# Patient Record
Sex: Female | Born: 1990 | Race: Black or African American | Hispanic: No | Marital: Single | Smoking: Never smoker
Health system: Southern US, Community
[De-identification: ages and names within clinical notes are randomized; demographics above are authoritative.]

## PROBLEM LIST (undated history)

## (undated) ENCOUNTER — Inpatient Hospital Stay (HOSPITAL_COMMUNITY): Payer: Medicaid Other

## (undated) ENCOUNTER — Inpatient Hospital Stay (HOSPITAL_COMMUNITY): Payer: Self-pay

## (undated) DIAGNOSIS — F419 Anxiety disorder, unspecified: Secondary | ICD-10-CM

## (undated) DIAGNOSIS — A549 Gonococcal infection, unspecified: Secondary | ICD-10-CM

## (undated) DIAGNOSIS — F32A Depression, unspecified: Secondary | ICD-10-CM

## (undated) DIAGNOSIS — I10 Essential (primary) hypertension: Secondary | ICD-10-CM

## (undated) DIAGNOSIS — A749 Chlamydial infection, unspecified: Secondary | ICD-10-CM

## (undated) DIAGNOSIS — O139 Gestational [pregnancy-induced] hypertension without significant proteinuria, unspecified trimester: Secondary | ICD-10-CM

## (undated) DIAGNOSIS — R87629 Unspecified abnormal cytological findings in specimens from vagina: Secondary | ICD-10-CM

## (undated) DIAGNOSIS — K5900 Constipation, unspecified: Secondary | ICD-10-CM

## (undated) DIAGNOSIS — F329 Major depressive disorder, single episode, unspecified: Secondary | ICD-10-CM

---
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
## 2012-09-03 ENCOUNTER — Inpatient Hospital Stay (HOSPITAL_COMMUNITY)
Admission: AD | Admit: 2012-09-03 | Discharge: 2012-09-03 | Disposition: A | Discharge status: Home / Self Care | Payer: Medicaid Other | Source: Ambulatory Visit | Attending: Obstetrics & Gynecology | Admitting: Obstetrics & Gynecology

## 2012-09-03 ENCOUNTER — Encounter (HOSPITAL_COMMUNITY): Payer: Self-pay | Admitting: *Deleted

## 2012-09-03 DIAGNOSIS — B9689 Other specified bacterial agents as the cause of diseases classified elsewhere: Secondary | ICD-10-CM

## 2012-09-03 DIAGNOSIS — A499 Bacterial infection, unspecified: Secondary | ICD-10-CM | POA: Insufficient documentation

## 2012-09-03 DIAGNOSIS — N760 Acute vaginitis: Secondary | ICD-10-CM | POA: Insufficient documentation

## 2012-09-03 DIAGNOSIS — Z3201 Encounter for pregnancy test, result positive: Secondary | ICD-10-CM | POA: Insufficient documentation

## 2012-09-03 HISTORY — DX: Chlamydial infection, unspecified: A74.9

## 2012-09-03 HISTORY — DX: Gonococcal infection, unspecified: A54.9

## 2012-09-03 HISTORY — DX: Constipation, unspecified: K59.00

## 2012-09-03 LAB — WET PREP, GENITAL: Trich, Wet Prep: NONE SEEN

## 2012-09-03 LAB — POCT PREGNANCY, URINE: Preg Test, Ur: POSITIVE — AB

## 2012-09-03 NOTE — MAU Note (Signed)
+  HPT last month.  No bleeding, no pain.  Just wants confirmation

## 2012-09-04 LAB — GC/CHLAMYDIA PROBE AMP: GC Probe RNA: NEGATIVE

## 2012-11-15 ENCOUNTER — Ambulatory Visit (INDEPENDENT_AMBULATORY_CARE_PROVIDER_SITE_OTHER): Payer: Medicaid Other | Admitting: Obstetrics

## 2012-11-15 ENCOUNTER — Encounter: Payer: Self-pay | Admitting: Obstetrics

## 2012-11-15 VITALS — BP 117/70 | Temp 98.2°F | Wt 199.0 lb

## 2012-11-15 DIAGNOSIS — Z3480 Encounter for supervision of other normal pregnancy, unspecified trimester: Secondary | ICD-10-CM

## 2012-11-15 DIAGNOSIS — Z113 Encounter for screening for infections with a predominantly sexual mode of transmission: Secondary | ICD-10-CM

## 2012-11-15 DIAGNOSIS — A568 Sexually transmitted chlamydial infection of other sites: Secondary | ICD-10-CM | POA: Insufficient documentation

## 2012-11-15 DIAGNOSIS — O98319 Other infections with a predominantly sexual mode of transmission complicating pregnancy, unspecified trimester: Secondary | ICD-10-CM

## 2012-11-15 DIAGNOSIS — Z369 Encounter for antenatal screening, unspecified: Secondary | ICD-10-CM

## 2012-11-15 DIAGNOSIS — Z3482 Encounter for supervision of other normal pregnancy, second trimester: Secondary | ICD-10-CM

## 2012-11-15 LAB — POCT URINALYSIS DIPSTICK
Blood, UA: NEGATIVE
Glucose, UA: NEGATIVE
Ketones, UA: NEGATIVE
Nitrite, UA: NEGATIVE
pH, UA: 6

## 2012-11-15 MED ORDER — OB COMPLETE PETITE 35-5-1-200 MG PO CAPS: 1.0000 | ORAL_CAPSULE | Freq: Every day | ORAL | Status: DC

## 2012-11-15 NOTE — Patient Instructions (Signed)
Safe sex.

## 2012-11-15 NOTE — Addendum Note (Signed)
Addended by: George Hugh on: 11/15/2012 12:17 PM   Modules accepted: Orders

## 2012-11-15 NOTE — Progress Notes (Signed)
Pulse- 96 . Subjective:    Kimberly [name] is being seen today for her first obstetrical visit.  This is not a planned pregnancy. She is at [redacted]w[redacted]d gestation. Her obstetrical history is significant for Chlamydia infection. Relationship with FOB: significant other, living together. Patient does not intend to breast feed. Pregnancy history fully reviewed.  Menstrual History: OB History   Grav Para Term Preterm Abortions TAB SAB Ect Mult Living   3 2 2       1       Menarche age: 75 Patient's last menstrual period was 06/23/2012.    The following portions of the patient's history were reviewed and updated as appropriate: allergies, current medications, past family history, past medical history, past social history, past surgical history and problem list.  Review of Systems Pertinent items are noted in HPI.    Objective:    General appearance: alert and no distress Abdomen: normal findings: soft, non-tender Pelvic: cervix normal in appearance, external genitalia normal, no adnexal masses or tenderness, no cervical motion tenderness, uterus normal size, shape, and consistency and Vagina with moderate yellowish discharge    Assessment:    Pregnancy at [redacted]w[redacted]d weeks  PNV Rx   Plan:    Initial labs drawn. Prenatal vitamins. Problem list reviewed and updated. AFP3 discussed: requested. Role of ultrasound in pregnancy discussed; fetal survey: requested. Amniocentesis discussed: not indicated. Follow up in 2 weeks. 50% of 20 min visit spent on counseling and coordination of care.

## 2012-11-16 LAB — VITAMIN D 25 HYDROXY (VIT D DEFICIENCY, FRACTURES): Vit D, 25-Hydroxy: 26 ng/mL — ABNORMAL LOW (ref 30–89)

## 2012-11-16 LAB — OBSTETRIC PANEL
Antibody Screen: NEGATIVE
HCT: 34.5 % — ABNORMAL LOW (ref 36.0–46.0)
Hemoglobin: 11.6 g/dL — ABNORMAL LOW (ref 12.0–15.0)
Hepatitis B Surface Ag: NEGATIVE
Lymphs Abs: 1.4 10*3/uL (ref 0.7–4.0)
Monocytes Absolute: 0.8 10*3/uL (ref 0.1–1.0)
Monocytes Relative: 7 % (ref 3–12)
Neutro Abs: 8 10*3/uL — ABNORMAL HIGH (ref 1.7–7.7)
Neutrophils Relative %: 78 % — ABNORMAL HIGH (ref 43–77)
RBC: 3.98 MIL/uL (ref 3.87–5.11)
Rh Type: POSITIVE
Rubella: 0.79 Index (ref <0.90)

## 2012-11-16 LAB — WET PREP BY MOLECULAR PROBE
Candida species: NEGATIVE
Trichomonas vaginosis: NEGATIVE

## 2012-11-16 LAB — AFP, QUAD SCREEN
AFP: 45.6 IU/mL
Age Alone: 1:1180 {titer}
Down Syndrome Scr Risk Est: 1:700 {titer}
HCG, Total: 23406 m[IU]/mL
MoM for INH: 1.05
MoM for hCG: 2.02
Open Spina bifida: NEGATIVE
Trisomy 18 (Edward) Syndrome Interp.: 1:24700 {titer}

## 2012-11-16 LAB — HIV ANTIBODY (ROUTINE TESTING W REFLEX): HIV: NONREACTIVE

## 2012-11-16 LAB — VARICELLA ZOSTER ANTIBODY, IGG: Varicella IgG: 1946 Index — ABNORMAL HIGH (ref <135.00)

## 2012-11-17 LAB — GC/CHLAMYDIA PROBE AMP: CT Probe RNA: NEGATIVE

## 2012-11-19 LAB — HEMOGLOBINOPATHY EVALUATION
Hemoglobin Other: 0 %
Hgb A2 Quant: 3 % (ref 2.2–3.2)
Hgb A: 97 % (ref 96.8–97.8)

## 2012-11-21 ENCOUNTER — Ambulatory Visit (INDEPENDENT_AMBULATORY_CARE_PROVIDER_SITE_OTHER): Payer: Medicaid Other

## 2012-11-21 DIAGNOSIS — Z3482 Encounter for supervision of other normal pregnancy, second trimester: Secondary | ICD-10-CM

## 2012-11-21 DIAGNOSIS — Z363 Encounter for antenatal screening for malformations: Secondary | ICD-10-CM

## 2012-11-21 DIAGNOSIS — Z369 Encounter for antenatal screening, unspecified: Secondary | ICD-10-CM

## 2012-11-21 DIAGNOSIS — Z1389 Encounter for screening for other disorder: Secondary | ICD-10-CM

## 2012-11-21 LAB — US OB DETAIL + 14 WK

## 2012-11-22 ENCOUNTER — Encounter: Payer: Self-pay | Admitting: Obstetrics

## 2012-11-22 LAB — US OB DETAIL + 14 WK

## 2012-11-27 ENCOUNTER — Encounter: Payer: Self-pay | Admitting: Obstetrics

## 2012-11-27 ENCOUNTER — Ambulatory Visit (INDEPENDENT_AMBULATORY_CARE_PROVIDER_SITE_OTHER): Payer: Medicaid Other | Admitting: Obstetrics

## 2012-11-27 VITALS — BP 136/83 | Temp 99.0°F | Wt 196.0 lb

## 2012-11-27 DIAGNOSIS — Z3480 Encounter for supervision of other normal pregnancy, unspecified trimester: Secondary | ICD-10-CM

## 2012-11-27 LAB — POCT URINALYSIS DIPSTICK
Glucose, UA: NEGATIVE
Nitrite, UA: NEGATIVE
Spec Grav, UA: 1.02
Urobilinogen, UA: NEGATIVE

## 2012-11-27 NOTE — Progress Notes (Signed)
Pulse 114- Patient has no concerns

## 2012-11-29 ENCOUNTER — Other Ambulatory Visit: Payer: Self-pay | Admitting: *Deleted

## 2012-11-29 DIAGNOSIS — B9689 Other specified bacterial agents as the cause of diseases classified elsewhere: Secondary | ICD-10-CM

## 2012-11-29 MED ORDER — TINIDAZOLE 500 MG PO TABS: 1000.0000 mg | ORAL_TABLET | Freq: Every day | ORAL | Status: DC

## 2012-12-06 ENCOUNTER — Encounter: Payer: Self-pay | Admitting: Obstetrics

## 2012-12-24 ENCOUNTER — Ambulatory Visit (INDEPENDENT_AMBULATORY_CARE_PROVIDER_SITE_OTHER): Payer: Medicaid Other | Admitting: Obstetrics

## 2012-12-24 ENCOUNTER — Encounter: Payer: Self-pay | Admitting: Obstetrics

## 2012-12-24 ENCOUNTER — Other Ambulatory Visit: Payer: Medicaid Other | Admitting: *Deleted

## 2012-12-24 VITALS — Temp 98.4°F | Wt 205.8 lb

## 2012-12-24 DIAGNOSIS — Z3480 Encounter for supervision of other normal pregnancy, unspecified trimester: Secondary | ICD-10-CM

## 2012-12-24 DIAGNOSIS — Z3483 Encounter for supervision of other normal pregnancy, third trimester: Secondary | ICD-10-CM

## 2012-12-24 DIAGNOSIS — Z3482 Encounter for supervision of other normal pregnancy, second trimester: Secondary | ICD-10-CM

## 2012-12-24 LAB — POCT URINALYSIS DIPSTICK
Leukocytes, UA: NEGATIVE
Nitrite, UA: NEGATIVE
Urobilinogen, UA: NEGATIVE

## 2012-12-24 NOTE — Progress Notes (Signed)
Pulse: 102

## 2012-12-25 LAB — CBC
HCT: 34.9 % — ABNORMAL LOW (ref 36.0–46.0)
MCH: 29 pg (ref 26.0–34.0)
MCV: 87.3 fL (ref 78.0–100.0)
Platelets: 278 10*3/uL (ref 150–400)
RDW: 13.9 % (ref 11.5–15.5)

## 2012-12-25 LAB — GLUCOSE TOLERANCE, 2 HOURS W/ 1HR: Glucose, Fasting: 60 mg/dL — ABNORMAL LOW (ref 70–99)

## 2012-12-28 ENCOUNTER — Inpatient Hospital Stay (HOSPITAL_COMMUNITY)
Admission: AD | Admit: 2012-12-28 | Discharge: 2012-12-28 | Disposition: A | Discharge status: Home / Self Care | Payer: Medicaid Other | Source: Ambulatory Visit | Attending: Obstetrics & Gynecology | Admitting: Obstetrics & Gynecology

## 2012-12-28 ENCOUNTER — Encounter (HOSPITAL_COMMUNITY): Payer: Self-pay | Admitting: *Deleted

## 2012-12-28 DIAGNOSIS — N760 Acute vaginitis: Secondary | ICD-10-CM | POA: Insufficient documentation

## 2012-12-28 DIAGNOSIS — N898 Other specified noninflammatory disorders of vagina: Secondary | ICD-10-CM

## 2012-12-28 DIAGNOSIS — A499 Bacterial infection, unspecified: Secondary | ICD-10-CM | POA: Insufficient documentation

## 2012-12-28 DIAGNOSIS — O2390 Unspecified genitourinary tract infection in pregnancy, unspecified trimester: Secondary | ICD-10-CM | POA: Insufficient documentation

## 2012-12-28 DIAGNOSIS — B9689 Other specified bacterial agents as the cause of diseases classified elsewhere: Secondary | ICD-10-CM | POA: Insufficient documentation

## 2012-12-28 DIAGNOSIS — L293 Anogenital pruritus, unspecified: Secondary | ICD-10-CM | POA: Insufficient documentation

## 2012-12-28 LAB — URINALYSIS, ROUTINE W REFLEX MICROSCOPIC
Glucose, UA: NEGATIVE mg/dL
Hgb urine dipstick: NEGATIVE
Ketones, ur: 15 mg/dL — AB
Protein, ur: NEGATIVE mg/dL
pH: 6 (ref 5.0–8.0)

## 2012-12-28 LAB — WET PREP, GENITAL
Clue Cells Wet Prep HPF POC: NONE SEEN
Yeast Wet Prep HPF POC: NONE SEEN

## 2012-12-28 MED ORDER — TERCONAZOLE 0.4 % VA CREA: 1.0000 | TOPICAL_CREAM | Freq: Every day | VAGINAL | Status: DC

## 2012-12-28 MED ORDER — TRIAMCINOLONE ACETONIDE 0.1 % EX CREA: TOPICAL_CREAM | Freq: Two times a day (BID) | CUTANEOUS | Status: DC

## 2012-12-28 NOTE — MAU Provider Note (Signed)
History     CSN: [id number]  Arrival date and time: 12/28/12 1709   First Provider Initiated Contact with Patient 12/28/12 1754      Chief Complaint  Patient presents with  . Vaginal Itching   HPI Kimberly [name] is 22 y.o. G2P1001 [redacted]w[redacted]d weeks presenting with ? Yeast infection.  States she is a patient of Dr. Thomes Lolling.  Did not call office.  Had Dx of bacterial vaginosis last week in the office and not having a white , clumpy discharge that itches.  Denies vaginal bleeding or abdominal pain.  + Fetal movement.     Past Medical History  Diagnosis Date  . Gonorrhea   . Chlamydia   . Constipation     Past Surgical History  Procedure Laterality Date  . No past surgeries      Family History  Problem Relation Age of Onset  . Cancer Neg Hx   . Diabetes Neg Hx   . Heart disease Neg Hx   . Hypertension Neg Hx   . Stroke Neg Hx     History  Substance Use Topics  . Smoking status: Never Smoker   . Smokeless tobacco: Never Used  . Alcohol Use: No    Allergies: No Known Allergies  Prescriptions prior to admission  Medication Sig Dispense Refill  . Prenat-FeCbn-FeAspGl-FA-Omega (OB COMPLETE PETITE) 35-5-1-200 MG CAPS Take 1 capsule by mouth daily before breakfast.  90 capsule  3    Review of Systems  Gastrointestinal: Negative for abdominal pain.  Genitourinary:       Neg for vaginal bleeding Positive for external itching, white discharge without odor   Physical Exam   Blood pressure 137/81, pulse 126, temperature 98.9 F (37.2 C), temperature source Oral, resp. rate 18, height 5' 3.75" (1.619 m), weight 206 lb 12.8 oz (93.804 kg), last menstrual period 06/23/2012, SpO2 100.00%.  Physical Exam  Constitutional: She appears well-developed and well-nourished. No distress.  HENT:  Head: Normocephalic.  Neck: Normal range of motion.  Cardiovascular: Normal rate.   Respiratory: Effort normal.  GI: Soft. There is no tenderness.  Genitourinary: There is rash and  tenderness on the right labia. There is no lesion on the right labia. There is rash and tenderness on the left labia. There is no lesion on the left labia. No erythema, tenderness or bleeding around the vagina. Vaginal discharge (small amount of curd like discharge without odor) found.  Bilateral labor majora with mild excoriations without lesions.  Slight red.   Neurological: She is alert.  Skin: Skin is warm and dry.  Psychiatric: She has a normal mood and affect. Her behavior is normal.   Results for orders placed during the hospital encounter of 12/28/12 (from the past 24 hour(s))  URINALYSIS, ROUTINE W REFLEX MICROSCOPIC     Status: Abnormal   Collection Time    12/28/12  5:15 PM      Result Value Range   Color, Urine YELLOW  YELLOW   APPearance CLEAR  CLEAR   Specific Gravity, Urine >1.030 (*) 1.005 - 1.030   pH 6.0  5.0 - 8.0   Glucose, UA NEGATIVE  NEGATIVE mg/dL   Hgb urine dipstick NEGATIVE  NEGATIVE   Bilirubin Urine NEGATIVE  NEGATIVE   Ketones, ur 15 (*) NEGATIVE mg/dL   Protein, ur NEGATIVE  NEGATIVE mg/dL   Urobilinogen, UA 0.2  0.0 - 1.0 mg/dL   Nitrite NEGATIVE  NEGATIVE   Leukocytes, UA NEGATIVE  NEGATIVE  WET PREP, GENITAL  Status: Abnormal   Collection Time    12/28/12  6:00 PM      Result Value Range   Yeast Wet Prep HPF POC NONE SEEN  NONE SEEN   Trich, Wet Prep NONE SEEN  NONE SEEN   Clue Cells Wet Prep HPF POC NONE SEEN  NONE SEEN   WBC, Wet Prep HPF POC FEW (*) NONE SEEN    MAU Course  Procedures  MDM   Assessment and Plan  A;  Vaginal pruritis after antibiotic treatment for Bacterial vaginosis  P:  Rx for Terozol 7 Vaginal Creme, 1 applicator at hs X 7      Rx for triamcinolone cream to mix with a small amount of terazol to apply externally to affected area bid prn    Follow up with Dr. Clearance Coots.   Soo Steelman,EVE M 12/28/2012, 6:32 PM

## 2012-12-28 NOTE — MAU Note (Signed)
Pt states vaginal itching started on Monday with white discharge.  Pt also c/o vaginal area being swollen.  Denies vaginal bleeding or ROM.  Good fetal movement.

## 2013-01-07 ENCOUNTER — Encounter: Payer: Self-pay | Admitting: Obstetrics

## 2013-01-07 ENCOUNTER — Ambulatory Visit (INDEPENDENT_AMBULATORY_CARE_PROVIDER_SITE_OTHER): Payer: Medicaid Other | Admitting: Obstetrics

## 2013-01-07 VITALS — BP 133/83 | Temp 98.0°F | Wt 206.0 lb

## 2013-01-07 DIAGNOSIS — Z3480 Encounter for supervision of other normal pregnancy, unspecified trimester: Secondary | ICD-10-CM

## 2013-01-07 LAB — POCT URINALYSIS DIPSTICK
Nitrite, UA: NEGATIVE
Urobilinogen, UA: NEGATIVE
pH, UA: 6

## 2013-01-07 NOTE — Progress Notes (Signed)
Pulse-110 Pt c/o right side headaches every morning x 1 week.

## 2013-01-21 ENCOUNTER — Encounter: Payer: Self-pay | Admitting: Obstetrics

## 2013-01-21 ENCOUNTER — Ambulatory Visit (INDEPENDENT_AMBULATORY_CARE_PROVIDER_SITE_OTHER): Payer: Medicaid Other | Admitting: Obstetrics

## 2013-01-21 VITALS — BP 128/79 | Temp 98.5°F | Wt 209.0 lb

## 2013-01-21 DIAGNOSIS — Z3483 Encounter for supervision of other normal pregnancy, third trimester: Secondary | ICD-10-CM

## 2013-01-21 DIAGNOSIS — Z3480 Encounter for supervision of other normal pregnancy, unspecified trimester: Secondary | ICD-10-CM

## 2013-01-21 LAB — POCT URINALYSIS DIPSTICK
Blood, UA: NEGATIVE
Nitrite, UA: NEGATIVE
Urobilinogen, UA: NEGATIVE
pH, UA: 5

## 2013-01-21 NOTE — Progress Notes (Signed)
Pulse-101 No complaints.

## 2013-02-04 ENCOUNTER — Ambulatory Visit (INDEPENDENT_AMBULATORY_CARE_PROVIDER_SITE_OTHER): Payer: Medicaid Other | Admitting: Obstetrics

## 2013-02-04 ENCOUNTER — Encounter: Payer: Self-pay | Admitting: Obstetrics

## 2013-02-04 VITALS — BP 120/82 | Temp 99.2°F | Wt 213.4 lb

## 2013-02-04 DIAGNOSIS — Z3480 Encounter for supervision of other normal pregnancy, unspecified trimester: Secondary | ICD-10-CM

## 2013-02-04 DIAGNOSIS — Z3483 Encounter for supervision of other normal pregnancy, third trimester: Secondary | ICD-10-CM

## 2013-02-04 LAB — POCT URINALYSIS DIPSTICK
Blood, UA: NEGATIVE
Glucose, UA: NEGATIVE
Ketones, UA: NEGATIVE
Protein, UA: NEGATIVE
Spec Grav, UA: 1.02
Urobilinogen, UA: NEGATIVE

## 2013-02-04 NOTE — Progress Notes (Signed)
Pulse: 97

## 2013-02-18 ENCOUNTER — Ambulatory Visit (INDEPENDENT_AMBULATORY_CARE_PROVIDER_SITE_OTHER): Payer: Medicaid Other | Admitting: Obstetrics

## 2013-02-18 ENCOUNTER — Encounter: Payer: Self-pay | Admitting: Obstetrics

## 2013-02-18 VITALS — BP 133/82 | Temp 98.3°F | Wt 214.0 lb

## 2013-02-18 DIAGNOSIS — Z3480 Encounter for supervision of other normal pregnancy, unspecified trimester: Secondary | ICD-10-CM

## 2013-02-18 LAB — POCT URINALYSIS DIPSTICK
Bilirubin, UA: NEGATIVE
Ketones, UA: NEGATIVE
Spec Grav, UA: 1.02

## 2013-02-18 NOTE — Progress Notes (Signed)
Pulse-104 No complaints

## 2013-02-20 LAB — STREP B DNA PROBE: GBSP: POSITIVE

## 2013-02-25 ENCOUNTER — Encounter: Payer: Self-pay | Admitting: Obstetrics

## 2013-02-25 ENCOUNTER — Ambulatory Visit (INDEPENDENT_AMBULATORY_CARE_PROVIDER_SITE_OTHER): Payer: Medicaid Other | Admitting: Obstetrics

## 2013-02-25 VITALS — BP 136/84 | Temp 98.8°F | Wt 217.0 lb

## 2013-02-25 DIAGNOSIS — Z3480 Encounter for supervision of other normal pregnancy, unspecified trimester: Secondary | ICD-10-CM

## 2013-02-25 DIAGNOSIS — Z3483 Encounter for supervision of other normal pregnancy, third trimester: Secondary | ICD-10-CM

## 2013-02-25 LAB — POCT URINALYSIS DIPSTICK
Glucose, UA: NEGATIVE
Ketones, UA: NEGATIVE
Protein, UA: NEGATIVE
Spec Grav, UA: 1.015
pH, UA: 6

## 2013-02-25 NOTE — Progress Notes (Signed)
P- 123

## 2013-03-04 ENCOUNTER — Encounter: Payer: Medicaid Other | Admitting: Obstetrics

## 2013-03-11 ENCOUNTER — Encounter: Payer: Medicaid Other | Admitting: Obstetrics

## 2013-03-13 ENCOUNTER — Ambulatory Visit (INDEPENDENT_AMBULATORY_CARE_PROVIDER_SITE_OTHER): Payer: Medicaid Other | Admitting: Obstetrics

## 2013-03-13 ENCOUNTER — Encounter: Payer: Self-pay | Admitting: Obstetrics

## 2013-03-13 VITALS — BP 130/84 | Temp 98.4°F | Wt 215.6 lb

## 2013-03-13 DIAGNOSIS — Z3483 Encounter for supervision of other normal pregnancy, third trimester: Secondary | ICD-10-CM

## 2013-03-13 DIAGNOSIS — Z3480 Encounter for supervision of other normal pregnancy, unspecified trimester: Secondary | ICD-10-CM

## 2013-03-13 NOTE — Progress Notes (Signed)
Pulse: 89

## 2013-03-14 LAB — POCT URINALYSIS DIPSTICK
Bilirubin, UA: NEGATIVE
Blood, UA: NEGATIVE
Ketones, UA: NEGATIVE
pH, UA: 7

## 2013-03-19 ENCOUNTER — Ambulatory Visit (INDEPENDENT_AMBULATORY_CARE_PROVIDER_SITE_OTHER): Payer: Medicaid Other | Admitting: Obstetrics

## 2013-03-19 ENCOUNTER — Inpatient Hospital Stay (HOSPITAL_COMMUNITY)
Admission: AD | Admit: 2013-03-19 | Discharge: 2013-03-23 | DRG: 765 | Disposition: A | Discharge status: Home / Self Care | Payer: Medicaid Other | Source: Ambulatory Visit | Attending: Obstetrics | Admitting: Obstetrics

## 2013-03-19 VITALS — BP 134/84 | Temp 98.8°F | Wt 218.0 lb

## 2013-03-19 DIAGNOSIS — O364XX0 Maternal care for intrauterine death, not applicable or unspecified: Secondary | ICD-10-CM | POA: Clinically undetermined

## 2013-03-19 DIAGNOSIS — Z3482 Encounter for supervision of other normal pregnancy, second trimester: Secondary | ICD-10-CM

## 2013-03-19 DIAGNOSIS — O99892 Other specified diseases and conditions complicating childbirth: Secondary | ICD-10-CM | POA: Diagnosis present

## 2013-03-19 DIAGNOSIS — O4590 Premature separation of placenta, unspecified, unspecified trimester: Secondary | ICD-10-CM | POA: Diagnosis present

## 2013-03-19 DIAGNOSIS — Z22330 Carrier of Group B streptococcus: Secondary | ICD-10-CM

## 2013-03-19 DIAGNOSIS — O365990 Maternal care for other known or suspected poor fetal growth, unspecified trimester, not applicable or unspecified: Secondary | ICD-10-CM | POA: Diagnosis present

## 2013-03-19 DIAGNOSIS — Z3480 Encounter for supervision of other normal pregnancy, unspecified trimester: Secondary | ICD-10-CM

## 2013-03-19 DIAGNOSIS — Z3483 Encounter for supervision of other normal pregnancy, third trimester: Secondary | ICD-10-CM

## 2013-03-19 LAB — POCT URINALYSIS DIPSTICK
Glucose, UA: 50
Spec Grav, UA: 1.02

## 2013-03-19 NOTE — Progress Notes (Signed)
Pulse: 108

## 2013-03-19 NOTE — MAU Note (Signed)
Contractions tonight with some bloody show. Denies LOF. Cervix has not been checked with this pregnancy. Previous TSVD.

## 2013-03-20 ENCOUNTER — Encounter (HOSPITAL_COMMUNITY): Payer: Self-pay | Admitting: Anesthesiology

## 2013-03-20 ENCOUNTER — Inpatient Hospital Stay (HOSPITAL_COMMUNITY): Payer: Medicaid Other | Admitting: Anesthesiology

## 2013-03-20 ENCOUNTER — Encounter (HOSPITAL_COMMUNITY): Payer: Self-pay

## 2013-03-20 ENCOUNTER — Encounter (HOSPITAL_COMMUNITY)
Admission: AD | Disposition: A | Discharge status: Home / Self Care | Payer: Self-pay | Source: Ambulatory Visit | Attending: Obstetrics

## 2013-03-20 DIAGNOSIS — O43019 Fetomaternal placental transfusion syndrome, unspecified trimester: Secondary | ICD-10-CM

## 2013-03-20 LAB — CBC
HCT: 29.3 % — ABNORMAL LOW (ref 36.0–46.0)
HCT: 35.9 % — ABNORMAL LOW (ref 36.0–46.0)
Hemoglobin: 10.2 g/dL — ABNORMAL LOW (ref 12.0–15.0)
MCHC: 34.8 g/dL (ref 30.0–36.0)
MCHC: 35.1 g/dL (ref 30.0–36.0)
MCV: 85.7 fL (ref 78.0–100.0)
Platelets: 222 10*3/uL (ref 150–400)
RBC: 3.41 MIL/uL — ABNORMAL LOW (ref 3.87–5.11)
RDW: 12.6 % (ref 11.5–15.5)
WBC: 14.1 10*3/uL — ABNORMAL HIGH (ref 4.0–10.5)
WBC: 16 10*3/uL — ABNORMAL HIGH (ref 4.0–10.5)

## 2013-03-20 LAB — PROTIME-INR
INR: 0.99 (ref 0.00–1.49)
Prothrombin Time: 12.9 seconds (ref 11.6–15.2)

## 2013-03-20 SURGERY — Surgical Case
Anesthesia: General | Site: Abdomen | Wound class: Clean Contaminated

## 2013-03-20 MED ORDER — LACTATED RINGERS IV SOLN: 500.0000 mL | INTRAVENOUS | Status: DC | PRN

## 2013-03-20 MED ORDER — TETANUS-DIPHTH-ACELL PERTUSSIS 5-2.5-18.5 LF-MCG/0.5 IM SUSP
0.5000 mL | Freq: Once | INTRAMUSCULAR | Status: AC
  Administered 2013-03-21: 0.5 mL via INTRAMUSCULAR
  Filled 2013-03-20: qty 0.5

## 2013-03-20 MED ORDER — ONDANSETRON HCL 4 MG/2ML IJ SOLN: 4.0000 mg | Freq: Four times a day (QID) | INTRAMUSCULAR | Status: DC | PRN

## 2013-03-20 MED ORDER — HYDROMORPHONE 0.3 MG/ML IV SOLN: INTRAVENOUS | Status: DC

## 2013-03-20 MED ORDER — MENTHOL 3 MG MT LOZG: 1.0000 | LOZENGE | OROMUCOSAL | Status: DC | PRN

## 2013-03-20 MED ORDER — OXYTOCIN BOLUS FROM INFUSION: 500.0000 mL | INTRAVENOUS | Status: DC

## 2013-03-20 MED ORDER — DIPHENHYDRAMINE HCL 50 MG/ML IJ SOLN: 12.5000 mg | Freq: Four times a day (QID) | INTRAMUSCULAR | Status: DC | PRN

## 2013-03-20 MED ORDER — CITRIC ACID-SODIUM CITRATE 334-500 MG/5ML PO SOLN: 30.0000 mL | ORAL | Status: DC | PRN

## 2013-03-20 MED ORDER — METOCLOPRAMIDE HCL 5 MG/ML IJ SOLN
INTRAMUSCULAR | Status: AC
  Filled 2013-03-20: qty 2

## 2013-03-20 MED ORDER — CEFAZOLIN SODIUM-DEXTROSE 2-3 GM-% IV SOLR
INTRAVENOUS | Status: DC | PRN
  Administered 2013-03-20: 2 g via INTRAVENOUS

## 2013-03-20 MED ORDER — LIDOCAINE HCL (PF) 1 % IJ SOLN
30.0000 mL | INTRAMUSCULAR | Status: DC | PRN
  Filled 2013-03-20: qty 30

## 2013-03-20 MED ORDER — KETOROLAC TROMETHAMINE 30 MG/ML IJ SOLN
INTRAMUSCULAR | Status: AC
  Filled 2013-03-20: qty 1

## 2013-03-20 MED ORDER — OXYTOCIN 40 UNITS IN LACTATED RINGERS INFUSION - SIMPLE MED: 62.5000 mL/h | INTRAVENOUS | Status: DC

## 2013-03-20 MED ORDER — MEASLES, MUMPS & RUBELLA VAC ~~LOC~~ INJ
0.5000 mL | INJECTION | Freq: Once | SUBCUTANEOUS | Status: AC
  Administered 2013-03-22: 0.5 mL via SUBCUTANEOUS
  Filled 2013-03-20: qty 0.5

## 2013-03-20 MED ORDER — PRENATAL MULTIVITAMIN CH
1.0000 | ORAL_TABLET | Freq: Every day | ORAL | Status: DC
  Administered 2013-03-20 – 2013-03-22 (×3): 1 via ORAL
  Filled 2013-03-20 (×3): qty 1

## 2013-03-20 MED ORDER — KETOROLAC TROMETHAMINE 30 MG/ML IJ SOLN
15.0000 mg | Freq: Once | INTRAMUSCULAR | Status: AC | PRN
  Administered 2013-03-20: 30 mg via INTRAVENOUS

## 2013-03-20 MED ORDER — HYDROMORPHONE 0.3 MG/ML IV SOLN
INTRAVENOUS | Status: DC
  Administered 2013-03-20: 04:00:00 via INTRAVENOUS
  Administered 2013-03-20: 3.3 mg via INTRAVENOUS
  Administered 2013-03-20: 2 mg via INTRAVENOUS
  Filled 2013-03-20: qty 25

## 2013-03-20 MED ORDER — FAMOTIDINE IN NACL 20-0.9 MG/50ML-% IV SOLN
20.0000 mg | Freq: Once | INTRAVENOUS | Status: AC
  Administered 2013-03-20: 20 mg via INTRAVENOUS
  Filled 2013-03-20: qty 50

## 2013-03-20 MED ORDER — PROPOFOL 10 MG/ML IV BOLUS
INTRAVENOUS | Status: DC | PRN
  Administered 2013-03-20: 200 mg via INTRAVENOUS

## 2013-03-20 MED ORDER — SUCCINYLCHOLINE CHLORIDE 20 MG/ML IJ SOLN
INTRAMUSCULAR | Status: DC | PRN
  Administered 2013-03-20: 140 mg via INTRAVENOUS

## 2013-03-20 MED ORDER — CITRIC ACID-SODIUM CITRATE 334-500 MG/5ML PO SOLN
ORAL | Status: AC
  Administered 2013-03-20: 30 mL
  Filled 2013-03-20: qty 15

## 2013-03-20 MED ORDER — MIDAZOLAM HCL 2 MG/2ML IJ SOLN
INTRAMUSCULAR | Status: DC | PRN
  Administered 2013-03-20: 2 mg via INTRAVENOUS

## 2013-03-20 MED ORDER — SIMETHICONE 80 MG PO CHEW
80.0000 mg | CHEWABLE_TABLET | Freq: Three times a day (TID) | ORAL | Status: DC
  Administered 2013-03-20 – 2013-03-22 (×3): 80 mg via ORAL

## 2013-03-20 MED ORDER — SODIUM CHLORIDE 0.9 % IJ SOLN: 9.0000 mL | INTRAMUSCULAR | Status: DC | PRN

## 2013-03-20 MED ORDER — HYDROMORPHONE HCL PF 1 MG/ML IJ SOLN
INTRAMUSCULAR | Status: AC
  Filled 2013-03-20: qty 1

## 2013-03-20 MED ORDER — HYDROMORPHONE HCL PF 1 MG/ML IJ SOLN
0.2500 mg | INTRAMUSCULAR | Status: DC | PRN
  Administered 2013-03-20 (×4): 0.5 mg via INTRAVENOUS

## 2013-03-20 MED ORDER — OXYTOCIN 40 UNITS IN LACTATED RINGERS INFUSION - SIMPLE MED: 62.5000 mL/h | INTRAVENOUS | Status: AC

## 2013-03-20 MED ORDER — NALOXONE HCL 0.4 MG/ML IJ SOLN: 0.4000 mg | INTRAMUSCULAR | Status: DC | PRN

## 2013-03-20 MED ORDER — DIBUCAINE 1 % RE OINT: 1.0000 "application " | TOPICAL_OINTMENT | RECTAL | Status: DC | PRN

## 2013-03-20 MED ORDER — OXYCODONE-ACETAMINOPHEN 5-325 MG PO TABS
1.0000 | ORAL_TABLET | ORAL | Status: DC | PRN
  Administered 2013-03-20 (×2): 2 via ORAL
  Administered 2013-03-21: 1 via ORAL
  Administered 2013-03-21 – 2013-03-23 (×4): 2 via ORAL
  Filled 2013-03-20 (×4): qty 2
  Filled 2013-03-20: qty 1
  Filled 2013-03-20 (×2): qty 2

## 2013-03-20 MED ORDER — LACTATED RINGERS IV SOLN
INTRAVENOUS | Status: DC
  Administered 2013-03-20: 01:00:00 via INTRAVENOUS
  Administered 2013-03-20: 125 mL/h via INTRAVENOUS

## 2013-03-20 MED ORDER — ZOLPIDEM TARTRATE 5 MG PO TABS: 5.0000 mg | ORAL_TABLET | Freq: Every evening | ORAL | Status: DC | PRN

## 2013-03-20 MED ORDER — METOCLOPRAMIDE HCL 5 MG/ML IJ SOLN
10.0000 mg | Freq: Once | INTRAMUSCULAR | Status: AC
  Administered 2013-03-20: 10 mg via INTRAVENOUS

## 2013-03-20 MED ORDER — FENTANYL CITRATE 0.05 MG/ML IJ SOLN
INTRAMUSCULAR | Status: DC | PRN
  Administered 2013-03-20 (×2): 250 ug via INTRAVENOUS

## 2013-03-20 MED ORDER — OXYTOCIN 10 UNIT/ML IJ SOLN
40.0000 [IU] | INTRAVENOUS | Status: DC | PRN
  Administered 2013-03-20: 40 [IU] via INTRAVENOUS

## 2013-03-20 MED ORDER — IBUPROFEN 600 MG PO TABS
600.0000 mg | ORAL_TABLET | Freq: Four times a day (QID) | ORAL | Status: DC
  Administered 2013-03-20 – 2013-03-23 (×12): 600 mg via ORAL
  Filled 2013-03-20 (×12): qty 1

## 2013-03-20 MED ORDER — 0.9 % SODIUM CHLORIDE (POUR BTL) OPTIME
TOPICAL | Status: DC | PRN
  Administered 2013-03-20: 1000 mL

## 2013-03-20 MED ORDER — ACETAMINOPHEN 325 MG PO TABS: 650.0000 mg | ORAL_TABLET | ORAL | Status: DC | PRN

## 2013-03-20 MED ORDER — BUTORPHANOL TARTRATE 1 MG/ML IJ SOLN: 1.0000 mg | INTRAMUSCULAR | Status: DC | PRN

## 2013-03-20 MED ORDER — DIPHENHYDRAMINE HCL 25 MG PO CAPS: 25.0000 mg | ORAL_CAPSULE | Freq: Four times a day (QID) | ORAL | Status: DC | PRN

## 2013-03-20 MED ORDER — ONDANSETRON HCL 4 MG/2ML IJ SOLN
INTRAMUSCULAR | Status: DC | PRN
  Administered 2013-03-20: 4 mg via INTRAVENOUS

## 2013-03-20 MED ORDER — OXYCODONE-ACETAMINOPHEN 5-325 MG PO TABS: 1.0000 | ORAL_TABLET | ORAL | Status: DC | PRN

## 2013-03-20 MED ORDER — SODIUM CHLORIDE 0.9 % IV SOLN
2.0000 g | Freq: Once | INTRAVENOUS | Status: DC
  Filled 2013-03-20: qty 2000

## 2013-03-20 MED ORDER — IBUPROFEN 600 MG PO TABS: 600.0000 mg | ORAL_TABLET | Freq: Four times a day (QID) | ORAL | Status: DC | PRN

## 2013-03-20 MED ORDER — ONDANSETRON HCL 4 MG/2ML IJ SOLN: 4.0000 mg | INTRAMUSCULAR | Status: DC | PRN

## 2013-03-20 MED ORDER — ONDANSETRON HCL 4 MG/2ML IJ SOLN: 4.0000 mg | Freq: Once | INTRAMUSCULAR | Status: DC | PRN

## 2013-03-20 MED ORDER — FLEET ENEMA 7-19 GM/118ML RE ENEM: 1.0000 | ENEMA | RECTAL | Status: DC | PRN

## 2013-03-20 MED ORDER — WITCH HAZEL-GLYCERIN EX PADS: 1.0000 "application " | MEDICATED_PAD | CUTANEOUS | Status: DC | PRN

## 2013-03-20 MED ORDER — SIMETHICONE 80 MG PO CHEW: 80.0000 mg | CHEWABLE_TABLET | ORAL | Status: DC | PRN

## 2013-03-20 MED ORDER — LANOLIN HYDROUS EX OINT: 1.0000 "application " | TOPICAL_OINTMENT | CUTANEOUS | Status: DC | PRN

## 2013-03-20 MED ORDER — SENNOSIDES-DOCUSATE SODIUM 8.6-50 MG PO TABS
2.0000 | ORAL_TABLET | Freq: Every day | ORAL | Status: DC
  Administered 2013-03-20 – 2013-03-22 (×3): 2 via ORAL

## 2013-03-20 MED ORDER — ONDANSETRON HCL 4 MG PO TABS: 4.0000 mg | ORAL_TABLET | ORAL | Status: DC | PRN

## 2013-03-20 MED ORDER — DIPHENHYDRAMINE HCL 12.5 MG/5ML PO ELIX: 12.5000 mg | ORAL_SOLUTION | Freq: Four times a day (QID) | ORAL | Status: DC | PRN

## 2013-03-20 MED ORDER — LACTATED RINGERS IV SOLN
INTRAVENOUS | Status: DC
  Administered 2013-03-20: 06:00:00 via INTRAVENOUS

## 2013-03-20 MED ORDER — LACTATED RINGERS IV SOLN
INTRAVENOUS | Status: DC | PRN
  Administered 2013-03-20: 01:00:00 via INTRAVENOUS

## 2013-03-20 MED FILL — Epinephrine HCl Soln Prefilled Syringe 0.1 MG/ML: INTRAMUSCULAR | Qty: 10 | Status: AC

## 2013-03-20 SURGICAL SUPPLY — 30 items
CLAMP CORD UMBIL (MISCELLANEOUS) IMPLANT
CLOTH BEACON ORANGE TIMEOUT ST (SAFETY) ×2 IMPLANT
DERMABOND ADVANCED (GAUZE/BANDAGES/DRESSINGS) ×1
DERMABOND ADVANCED .7 DNX12 (GAUZE/BANDAGES/DRESSINGS) ×1 IMPLANT
DRAPE LG THREE QUARTER DISP (DRAPES) ×2 IMPLANT
DRSG OPSITE POSTOP 4X10 (GAUZE/BANDAGES/DRESSINGS) ×2 IMPLANT
DURAPREP 26ML APPLICATOR (WOUND CARE) IMPLANT
ELECT REM PT RETURN 9FT ADLT (ELECTROSURGICAL) ×2
ELECTRODE REM PT RTRN 9FT ADLT (ELECTROSURGICAL) ×1 IMPLANT
EXTRACTOR VACUUM M CUP 4 TUBE (SUCTIONS) IMPLANT
GLOVE BIO SURGEON STRL SZ8.5 (GLOVE) ×2 IMPLANT
GOWN PREVENTION PLUS XXLARGE (GOWN DISPOSABLE) ×2 IMPLANT
GOWN STRL REIN XL XLG (GOWN DISPOSABLE) ×4 IMPLANT
KIT ABG SYR 3ML LUER SLIP (SYRINGE) ×2 IMPLANT
NEEDLE HYPO 25X5/8 SAFETYGLIDE (NEEDLE) ×2 IMPLANT
NS IRRIG 1000ML POUR BTL (IV SOLUTION) ×2 IMPLANT
PACK C SECTION WH (CUSTOM PROCEDURE TRAY) ×2 IMPLANT
PAD OB MATERNITY 4.3X12.25 (PERSONAL CARE ITEMS) ×2 IMPLANT
SUT CHROMIC 0 CT 802H (SUTURE) ×2 IMPLANT
SUT CHROMIC 1 CTX 36 (SUTURE) ×4 IMPLANT
SUT CHROMIC 2 0 SH (SUTURE) ×2 IMPLANT
SUT GUT PLAIN 0 CT-3 TAN 27 (SUTURE) IMPLANT
SUT MON AB 4-0 PS1 27 (SUTURE) ×2 IMPLANT
SUT VIC AB 0 CT1 18XCR BRD8 (SUTURE) IMPLANT
SUT VIC AB 0 CT1 8-18 (SUTURE)
SUT VIC AB 0 CTX 36 (SUTURE) ×2
SUT VIC AB 0 CTX36XBRD ANBCTRL (SUTURE) ×2 IMPLANT
TOWEL OR 17X24 6PK STRL BLUE (TOWEL DISPOSABLE) ×2 IMPLANT
TRAY FOLEY CATH 14FR (SET/KITS/TRAYS/PACK) ×2 IMPLANT
WATER STERILE IRR 1000ML POUR (IV SOLUTION) IMPLANT

## 2013-03-20 NOTE — Progress Notes (Signed)
Abdomen prep, CHG, nasal swab, SCD's in place B.Sirius Woodford,RN

## 2013-03-20 NOTE — Progress Notes (Signed)
Pt c/o abdomen tender when moving monitors

## 2013-03-20 NOTE — Progress Notes (Signed)
Called Dr Rodman Pickle to inform of pt status on unit, informed of sve, bleeding, fhr tracing, and Dr Gaynell Face was on his way in house to evaluate pt. Orders received, and Anesthesia to come to pts room.

## 2013-03-20 NOTE — Progress Notes (Addendum)
Pt brought to room 163 on stretcher by two MAU RN's.  Pt transferred to birthing bed, vaginal bleeding noted on pad.  Monitors applied to abdomen  Time default: Note was made at 03/20/2013 @ 0040

## 2013-03-20 NOTE — Progress Notes (Signed)
Subjective: Postpartum Day 0: Cesarean Delivery Patient reports tolerating PO.    Objective: Vital signs in last 24 hours: Temp:  [97.8 F (36.6 C)-98.8 F (37.1 C)] 98.4 F (36.9 C) (09/03 0530) Pulse Rate:  [81-99] 83 (09/03 0530) Resp:  [12-20] 20 (09/03 0530) BP: (127-149)/(63-98) 131/87 mmHg (09/03 0530) SpO2:  [99 %-100 %] 100 % (09/03 0530) Weight:  [218 lb (98.884 kg)-218 lb 6.4 oz (99.066 kg)] 218 lb 6.4 oz (99.066 kg) (09/02 2359)  Physical Exam:  General: alert and no distress Lochia: appropriate Uterine Fundus: firm Incision: healing well DVT Evaluation: No evidence of DVT seen on physical exam.   Recent Labs  03/20/13 0015  HGB 12.6  HCT 35.9*    Assessment/Plan: Status post Cesarean section. Doing well postoperatively.  Continue current care.  Kimberly [name] A 03/20/2013, 5:53 AM

## 2013-03-20 NOTE — Consult Note (Signed)
[facility]  Delivery Note:  C-section       03/20/2013  1:46 AM  I was called to the operating room at the request of the patient's obstetrician (Dr. Marshall) due to STAT c/section at 38 weeks due to fetal bradycardia.  PRENATAL HX:  Began prenatal care at 20 weeks.  Unplanned pregnancy.    INTRAPARTUM HX:   Presented tonight with vaginal bleeding.  Noted to have late FHR decelerations.  OB performed u/s and noted fetal bradycardia so stat c/section ordered.  DELIVERY:   Vertex delivery following stat c/section.  Baby had no tone, respiratory effort.  Placed on radiant warmer bed, and quickly dried and bulb suctioned.  Bag/mask respiratory support begun.  HR checked and noted to be very low.  Chest compressions begun and code Apgar called.  Baby quickly intubated with 3.5 ETT by me (CO2 indicator showed appropriate color change indicating placement in trachea).  Additional team members arrived to join the resuscitation.  After about a minute of bagging with the ETT, the CO2 indicator lost the yellow color so baby quickly reintubated by me.  Breath sounds appreciated with bagging, and ETT showed condensation.  CO2 indicator again was yellow.  Despite further respiratory support, HR could no longer be appreciated with certainty between 3 and 5 minutes of age.  During the next 15 minutes, we gave 2 doses of intratracheal epinephrine, placed a UVC, gave 20 ml/kg normal saline infusion, and two more doses of IV epinephrine.  Breath sounds were equal .  HR was never detected.  When baby reached 20 minutes of age, with no sign of life, further resuscitation was discontinued.  Apgars scores were 1, 0, 0, 0, 0 at 1, 5, 10, 15, 20 minutes.  I called the baby officially deceased at 1:31 AM.  The baby's father arrived in the resuscitation room during the last 5 minutes.  A cord gas was run afterward, however pH was out of measurable range (6.7 to 7.8), so presumably the pH was less than 6.7.   Dr. Marshall said there was a partial uterine abruption.  The baby appeared small for gestation.  The baby will remain with mom's OB nurse, so that when mom awakens she can hold her baby.  At this time, it is not known if mom wishes an autopsy.   _____________________ Electronically Signed By: Gaelyn Tukes S. Chantell Kunkler, MD Neonatologist     

## 2013-03-20 NOTE — Progress Notes (Signed)
Dr. Burnice Logan Katrinka Blazing called to come evaluate strip.  Orders to call midwife to place IFSE.

## 2013-03-20 NOTE — Progress Notes (Signed)
Another RN at bedside.  

## 2013-03-20 NOTE — Progress Notes (Signed)
This note also relates to the following rows which could not be included: Pulse Rate - Cannot attach notes to unvalidated device data SpO2 - Cannot attach notes to unvalidated device data   MD at bedside to perform bedside US. Visual fht noted. B.Miyako Oelke,RN

## 2013-03-20 NOTE — H&P (Signed)
This is Dr. Francoise Ceo dictating the history and physical on Kimberly [name] she's a 22 year old gravida 2 para 101 at 9 weeks and 4 days EDC 03/30/2013 positive GBS patient's husband reported that she was in the shower at 11:00 PM when she started having some vaginal bleeding and cramping and they got to the emergency room at 11:30 PM she was placed on the monitor at 1213 in triage it was noted at a time that her cervix is 3 cm 50% and her bleeding was more than normal however it was not described as being excessive the fetal heart was 120 and there was no variability and it was 1 the cell to 90 I was called and asked if it the nurse thought the patient was abrupting and she said no the patient was laughing talking and not complaining of any pain patient was sent to labor and delivery and was placed on the monitor at 1242 on labor and delivery I was called at 12 429 consider and I was told the patient was having some D cells 1 down to 90 and one actually down to 60 the nurse and now reports that her abdomen was rigid at that time I was IM was in the patient's room at 1258 and and this time it was difficult of hearing the fetal heart rate critical ultrasound showed that the heart rate was less than 100 patient was rushed to the operating room and on the table in 103 general anesthesia was administered and the C-section was started at 109 and delivery at 1:11 AM and Apgars 10 female the placenta was posterior and there appeared to be a small abruption less than 10% and the placenta was removed manually and sent to pathology Past medical history negative Past surgical history negative Social history negative System review negative Physical exam well-developed female in in no distress HEENT negative Breasts negative Abdomen term Pelvic cervix is 3 cm 50% vertex -1 station Extremities negative

## 2013-03-20 NOTE — Progress Notes (Addendum)
Picking up low hr, o2 sat monitor applied  Time default: note was made on 03/20/13@ 0040

## 2013-03-20 NOTE — Progress Notes (Signed)
This note also relates to the following rows which could not be included: Pulse Rate - Cannot attach notes to unvalidated device data SpO2 - Cannot attach notes to unvalidated device data   Called Dr. Gaynell Face to inform him of current fhr tracing, interventions, requests MD presence at bedside to evaluate.  MD currently en route.

## 2013-03-20 NOTE — Progress Notes (Addendum)
This note also relates to the following rows which could not be included: Pulse Rate - Cannot attach notes to unvalidated device data SpO2 - Cannot attach notes to unvalidated device data   Additional RN at bedside, RROB requested  Time default:  Note made on 03/20/13 @ 0044

## 2013-03-20 NOTE — Anesthesia Postprocedure Evaluation (Signed)
  Anesthesia Post-op Note  Patient: Kimberly [name]  Procedure(s) Performed: Procedure(s): CESAREAN SECTION (N/A)  Patient Location: women's unit  Anesthesia Type:General  Level of Consciousness: awake, alert  and oriented  Airway and Oxygen Therapy: Patient Spontanous Breathing  Post-op Pain: mild  Post-op Assessment: Post-op Vital signs reviewed, Patient's Cardiovascular Status Stable, No headache, No backache, No residual numbness and No residual motor weakness  Post-op Vital Signs: Reviewed and stable  Complications: No apparent anesthesia complications

## 2013-03-20 NOTE — Progress Notes (Signed)
Ur chart review completed.  

## 2013-03-20 NOTE — Progress Notes (Signed)
OR notified of Gaynell Face stat c-section call.  OR ready and waiting.  Delivery call to NICU for stat c-section in c-section suites.

## 2013-03-20 NOTE — Transfer of Care (Signed)
Immediate Anesthesia Transfer of Care Note  Patient: Kimberly [name]  Procedure(s) Performed: Procedure(s): CESAREAN SECTION (N/A)  Patient Location: PACU  Anesthesia Type:General  Level of Consciousness: sedated  Airway & Oxygen Therapy: Patient Spontanous Breathing and Patient connected to nasal cannula oxygen  Post-op Assessment: Report given to PACU RN and Post -op Vital signs reviewed and stable  Post vital signs: stable  Complications: No apparent anesthesia complications

## 2013-03-20 NOTE — Op Note (Signed)
preop diagnosis fetal distress Postop diagnosis entered fetal demise Surgeon Dr. Francoise Ceo First assistant Dr. Richardson Dopp Anesthesia Gen. Procedure patient was taken to the operating room 010 3 AM with a fetal heart  Less than 100  abdomen rapidly prepped and draped  A  Foley catheter inserted and then general anesthesia administered transverse incision made at 1:09 AM   down to the fascia fascia incised the length of the incision recti muscles retracted laterally peritoneum incised longitudinally transverse incision made in the lower segment of the uterus and  a small amount of blood in the uterus and delivered a female at 01 11 AM Apgar 1 and 0 the team was in attendance and resuscitation of the baby started the placenta was posterior removed manually and it appeared that there was a less than 5% abruption the placenta the placenta  was sent to pathology uterine cavity clean with dry laps it appeared also that the baby was IUGR baby the uterine cavity was then closed in 2 layers with continuous  one chromic including myometrium and endometrium bladder flap reattached  wi9th 2o  chromic uterus well contracted abdomen closed in layers peritoneum continuous with of 0 chromic fascia continuous with of 0 Dexon and the skin closed with subcuticular stitch of 4-0 Monocryl blood loss was 500 cc

## 2013-03-20 NOTE — MAU Note (Signed)
Vaginal bleeding and FHR in 90's. Zorita Pang CNM notified. IV labs drawn. Dr Gaynell Face notified of tracing. Admission orders given.

## 2013-03-20 NOTE — Anesthesia Postprocedure Evaluation (Signed)
  Anesthesia Post-op Note  Anesthesia Post Note  Patient: Kimberly [name]  Procedure(s) Performed: Procedure(s) (LRB): CESAREAN SECTION (N/A)  Anesthesia type: General  Patient location: PACU  Post pain: Pain level controlled  Post assessment: Post-op Vital signs reviewed  Last Vitals:  Filed Vitals:   03/20/13 0245  BP: 149/91  Pulse: 93  Temp:   Resp: 17    Post vital signs: Reviewed  Level of consciousness: sedated  Complications: No apparent anesthesia complications

## 2013-03-20 NOTE — Progress Notes (Signed)
03/20/13 0900  Clinical Encounter Type  Visited With Patient and family together (boyfriend's mother Selena Batten)  Visit Type Spiritual support;Social support (loss of baby)  Referral From Nurse  Spiritual Encounters  Spiritual Needs Grief support;Emotional  Stress Factors  Patient Stress Factors Loss;Loss of control;Major life changes  Family Stress Factors Loss;Loss of control;Major life changes   Visited with Jon Gills and Selena Batten, her boyfriend Dwight's mother, to offer bereavement support.  Per pt, she has not experienced other deaths before, and she tends to cope with stress by being quiet and taking time to herself.  Her family is at work now and plans to visit later.  Kim, tearful and appreciative of care and hugs, has gone to a 10:00 appointment and will return later.    Riverlyn is starting to ask questions to understand more about what happened.  It will help her to hear details from MDs about how they did everything possible for baby Neveah.  Given that grief can make it hard for people to absorb and process details, she may need to speak with providers more than once to ask questions.  I consulted with Luisa Dago, RN about this need; she plans to ask MD to return for further conversation.  Thank you for providing such support to [facility] and family.  Provided pastoral presence, reflective listening, staff liaison support, and offer for grief education as pt moves further through her emotional process.  Will continue to follow for support. Please page as needed:  [phone].  Pt appreciative.  [address] [place], [place] [phone]

## 2013-03-20 NOTE — Progress Notes (Signed)
This note also relates to the following rows which could not be included: Pulse Rate - Cannot attach notes to unvalidated device data SpO2 - Cannot attach notes to unvalidated device data  Additional RN's at bedside,  Called MD to again report fhr, interventions, bleeding.

## 2013-03-20 NOTE — Progress Notes (Signed)
This note also relates to the following rows which could not be included: Pulse Rate - Cannot attach notes to unvalidated device data SpO2 - Cannot attach notes to unvalidated device data   Called OR to report pt status and set up for potential stat c-section

## 2013-03-20 NOTE — Progress Notes (Signed)
03/20/13 1530  Clinical Encounter Type  Visited With Patient  Visit Type Follow-up  Referral From Nurse   Followed up with Kimberly [name] briefly to check in about her spiritual and emotional needs and how she is doing, including how she is coping with the presence of a vistor's week-old baby in her room.  Pt stated in private that "it's okay," and is aware that staff can help support her visitation wishes.  Spiritual Care will continue to follow for support.  [address] [place], [place] [phone]

## 2013-03-20 NOTE — Anesthesia Preprocedure Evaluation (Addendum)
Anesthesia Evaluation  Patient identified by MRN, date of birth, ID band Patient awake    Reviewed: Allergy & Precautions, H&P , NPO status , Patient's Chart, lab work & pertinent test results, reviewed documented beta blocker date and time   History of Anesthesia Complications Negative for: history of anesthetic complications  Airway Mallampati: I TM Distance: >3 FB Neck ROM: full    Dental  (+) Teeth Intact   Pulmonary neg pulmonary ROS,  breath sounds clear to auscultation        Cardiovascular negative cardio ROS  Rhythm:regular Rate:Normal     Neuro/Psych negative neurological ROS  negative psych ROS   GI/Hepatic negative GI ROS, Neg liver ROS,   Endo/Other  Obese - BMI 38.8  Renal/GU negative Renal ROS  negative genitourinary   Musculoskeletal   Abdominal   Peds  Hematology negative hematology ROS (+)   Anesthesia Other Findings Last ate at 8 pm (full meal) - giving reglan, pepcid and bicitra  Reproductive/Obstetrics (+) Pregnancy (NRFS --> STAT C/S)                          Anesthesia Physical Anesthesia Plan  ASA: II and emergent  Anesthesia Plan: General ETT, Rapid Sequence and Cricoid Pressure   Post-op Pain Management:    Induction:   Airway Management Planned:   Additional Equipment:   Intra-op Plan:   Post-operative Plan:   Informed Consent: I have reviewed the patients History and Physical, chart, labs and discussed the procedure including the risks, benefits and alternatives for the proposed anesthesia with the patient or authorized representative who has indicated his/her understanding and acceptance.     Plan Discussed with: Surgeon and CRNA  Anesthesia Plan Comments:        Anesthesia Quick Evaluation Called at 12:45am by L&D RN for pt having NRFS - Dr Gaynell Face on the way.  Gave orders for reglan, pepcid and bicitra.  Notified OR staff of possible STAT  C/S.  Arrived at bedside at 12:48am, performed pre-op eval and escorted pt to OR when Dr Gaynell Face arrived and called STAT C/S.

## 2013-03-21 ENCOUNTER — Encounter: Payer: Self-pay | Admitting: Obstetrics

## 2013-03-21 DIAGNOSIS — O364XX0 Maternal care for intrauterine death, not applicable or unspecified: Secondary | ICD-10-CM | POA: Clinically undetermined

## 2013-03-21 NOTE — Progress Notes (Signed)
Patient ID: Kimberly [name], female   DOB: [birth year], 22 y.o.   MRN: [id number] Subjective: POD# 1 s/p Cesarean Delivery.  Indications: fetal demise  RH status/Rubella reviewed.  Patient reports tolerating PO.  Denies HA/SOB/C/P/N/V/dizziness.   Breast symptoms: no.  She reports vaginal bleeding as normal, without clots.  She is ambulating, urinating without difficulty.     Objective: Vital signs in last 24 hours: BP 136/84  Pulse 90  Temp(Src) 98 F (36.7 C) (Oral)  Resp 18  Ht 5\' 3"  (1.6 m)  Wt 218 lb 6.4 oz (99.066 kg)  BMI 38.7 kg/m2  SpO2 99%  LMP 06/23/2012       Physical Exam:  General: alert CV: Regular rate and rhythm Resp: clear Abdomen: soft, nontender, normal bowel sounds Lochia: minimal Uterine Fundus: firm, below umbilicus, nontender Incision: clean, dry and intact Ext: extremities normal, atraumatic, no cyanosis or edema    Recent Labs  03/20/13 0015 03/20/13 0520  HGB 12.6 10.2*  HCT 35.9* 29.3*      Assessment/Plan: 22 y.o.  status post Cesarean section. POD# 1.   Doing well physically, stable.              Advance diet as tolerated Start po pain meds D/C foley  HLIV  Ambulate IS Routine post-op care  JACKSON-MOORE,Ricka Westra A 03/21/2013, 9:19 AM

## 2013-03-21 NOTE — Progress Notes (Signed)
03/21/13 1200  Clinical Encounter Type  Visited With Patient  Visit Type Follow-up;Spiritual support;Social support  Spiritual Encounters  Spiritual Needs Grief support;Emotional   Very constructive follow-up visit.  Lois was alone and ready to share.  We talked about grieving process, support from family, fielding anxiety and difficult comments from people who mean to be helpful (but who want to fix what can't be fixed), coping with grief while also parenting, and what a healing joy her son Jackelyn Hoehn (age 20) can be.  Provided pastoral presence, reflective listening, encouragement, affirmation, and grief education.  Gabrial was very grateful and said that she had been wanting to share with me.   [address] [place], [place] [phone]

## 2013-03-22 NOTE — Progress Notes (Signed)
Subjective: Postpartum Day 2: Cesarean Delivery Patient reports tolerating PO, + flatus and no problems voiding.    Objective: Vital signs in last 24 hours: Temp:  [97.9 F (36.6 C)-98.6 F (37 C)] 98.3 F (36.8 C) (09/05 0604) Pulse Rate:  [69-112] 69 (09/05 0604) Resp:  [16-18] 16 (09/05 0604) BP: (126-155)/(75-90) 126/85 mmHg (09/05 0604) SpO2:  [98 %-100 %] 98 % (09/05 0604)  Physical Exam:  General: alert and no distress Lochia: appropriate Uterine Fundus: firm Incision: healing well DVT Evaluation: No evidence of DVT seen on physical exam.   Recent Labs  03/20/13 0015 03/20/13 0520  HGB 12.6 10.2*  HCT 35.9* 29.3*    Assessment/Plan: Status post Cesarean section. Doing well postoperatively.  Continue current care.  Kimberly [name] A 03/22/2013, 8:58 AM

## 2013-03-23 MED ORDER — ZOLPIDEM TARTRATE 5 MG PO TABS: 5.0000 mg | ORAL_TABLET | Freq: Every evening | ORAL | Status: DC | PRN

## 2013-03-23 MED ORDER — MEDROXYPROGESTERONE ACETATE 150 MG/ML IM SUSP: 150.0000 mg | INTRAMUSCULAR | Status: DC

## 2013-03-23 MED ORDER — OXYCODONE-ACETAMINOPHEN 5-325 MG PO TABS: 1.0000 | ORAL_TABLET | ORAL | Status: DC | PRN

## 2013-03-23 MED ORDER — OB COMPLETE PETITE 35-5-1-200 MG PO CAPS: 1.0000 | ORAL_CAPSULE | Freq: Every day | ORAL | Status: DC

## 2013-03-23 NOTE — Progress Notes (Signed)
Discharge instructions reviewed with patient.  Patient states understanding of home care, activity, medications, signs/symptoms to report to MD and return MD office visit.  Comfort information sent home with patient.  No home equipment needed.  Patient ambulated for discharge in stable condition with staff without incident.

## 2013-03-23 NOTE — Discharge Summary (Signed)
  Obstetric Discharge Summary Reason for Admission: vaginal bleeding Prenatal Procedures: none Intrapartum Procedures: cesarean: low cervical, transverse Postpartum Procedures: none Complications-Operative and Postpartum: none  Hemoglobin  Date Value Range Status  03/20/2013 10.2* 12.0 - 15.0 g/dL Final     DELTA CHECK NOTED     REPEATED TO VERIFY     HCT  Date Value Range Status  03/20/2013 29.3* 36.0 - 46.0 % Final    Physical Exam:  General: alert Lochia: appropriate Uterine: firm Incision: clean, dry and intact DVT Evaluation: No evidence of DVT seen on physical exam.  Discharge Diagnoses: Active Problems:   Fetal demise > 22 weeks, delivered, current hospitalization   Discharge Information: Date: 03/23/2013 Activity: pelvic rest Diet: routine Medications:  Prior to Admission medications   Medication Sig Start Date End Date Taking? Authorizing Provider  medroxyPROGESTERone (DEPO-PROVERA) 150 MG/ML injection Inject 1 mL (150 mg total) into the muscle every 3 (three) months. 03/23/13   Kimberly Char, MD  oxyCODONE-acetaminophen (PERCOCET/ROXICET) 5-325 MG per tablet Take 1-2 tablets by mouth every 4 (four) hours as needed. 03/23/13   Kimberly Char, MD  Prenat-FeCbn-FeAspGl-FA-Omega (OB COMPLETE PETITE) 35-5-1-200 MG CAPS Take 1 capsule by mouth daily before breakfast. 03/23/13   Kimberly Char, MD  zolpidem (AMBIEN) 5 MG tablet Take 1 tablet (5 mg total) by mouth at bedtime as needed for sleep. 03/23/13   Kimberly Char, MD    Condition: stable Instructions: refer to routine discharge instructions Discharge to: home Follow-up Information   Follow up with [name],Kimberly A, MD. Schedule an appointment as soon as possible for [name] visit in 2 weeks. (Return in 1 week for Depo provera)    Specialty:  Obstetrics and Gynecology   Contact information:   [address] [place] [address] [phone]       Newborn Data:  Live born female  Birth  Weight: 5 lb 3.8 oz (2376 g) APGAR: 1, 0   Home with mother.  Kimberly [name] 03/23/2013, 10:48 AM

## 2013-03-26 ENCOUNTER — Encounter: Payer: Medicaid Other | Admitting: Obstetrics

## 2013-03-26 ENCOUNTER — Telehealth: Payer: Self-pay | Admitting: *Deleted

## 2013-03-26 NOTE — Telephone Encounter (Signed)
Call from [place] at [facility] regarding a statement made by the patient's sister regarding concern for patient because she had expressed suicidal thoughts. Call to patient - discussed her wellbeing and she states she is doing ok. Expressed concern for her and her feelings that she may have coming to the office tomorrow for her visit. Patient stated she would see [place] tomorrow and I expressed that we were looking forward to her visit.

## 2013-03-27 ENCOUNTER — Ambulatory Visit (INDEPENDENT_AMBULATORY_CARE_PROVIDER_SITE_OTHER): Payer: Medicaid Other | Admitting: Obstetrics

## 2013-03-27 ENCOUNTER — Encounter: Payer: Self-pay | Admitting: Obstetrics

## 2013-03-27 DIAGNOSIS — R52 Pain, unspecified: Secondary | ICD-10-CM | POA: Insufficient documentation

## 2013-03-27 DIAGNOSIS — O165 Unspecified maternal hypertension, complicating the puerperium: Secondary | ICD-10-CM | POA: Insufficient documentation

## 2013-03-27 MED ORDER — TRIAMTERENE-HCTZ 37.5-25 MG PO CAPS: 1.0000 | ORAL_CAPSULE | ORAL | Status: DC

## 2013-03-27 MED ORDER — OXYCODONE-ACETAMINOPHEN 10-325 MG PO TABS: 1.0000 | ORAL_TABLET | Freq: Four times a day (QID) | ORAL | Status: DC | PRN

## 2013-03-27 MED ORDER — OXYCODONE-ACETAMINOPHEN 10-325 MG PO TABS: 1.0000 | ORAL_TABLET | ORAL | Status: DC | PRN

## 2013-03-27 MED ORDER — MEDROXYPROGESTERONE ACETATE 150 MG/ML IM SUSP
150.0000 mg | Freq: Once | INTRAMUSCULAR | Status: AC
  Administered 2013-03-27: 150 mg via INTRAMUSCULAR

## 2013-03-27 MED ORDER — IBUPROFEN 800 MG PO TABS: 800.0000 mg | ORAL_TABLET | Freq: Three times a day (TID) | ORAL | Status: DC | PRN

## 2013-03-27 NOTE — Addendum Note (Signed)
Addended by: Henriette Combs on: 03/27/2013 04:58 PM   Modules accepted: Orders

## 2013-03-27 NOTE — Progress Notes (Signed)
Subjective:     Kimberly [name] is a 22 y.o. female who presents for a postpartum visit. She is 1 week postpartum following a primary caesarean section/fetal demise . I have fully reviewed the prenatal and intrapartum course. The delivery was at 38.4 gestational weeks. Outcome: fetal demise. Anesthesia: spinal. Postpartum course has been WNL. Baby's course has been n/a. Bleeding thin lochia. Bowel function is normal. Bladder function is normal. Patient is not sexually active. Contraception method is abstinence. Postpartum depression screening: positive.  The following portions of the patient's history were reviewed and updated as appropriate: allergies, current medications, past family history, past medical history, past social history, past surgical history and problem list.  Review of Systems Pertinent items are noted in HPI.   Objective:    BP 135/91  Pulse 82  Temp(Src) 99 F (37.2 C)  Ht 5\' 3"  (1.6 m)  Wt 207 lb (93.895 kg)  BMI 36.68 kg/m2  LMP 06/23/2012   Abdomen:  Soft.  NT.  Incision C, D, I.  Assessment:      Normal postpartum exam. Pap smear not done at today's visit.   Plan:    1. Contraception: Depo-Provera injections 2. Depo Provera injection given today. 3. Follow up in: 2 weeks or as needed.

## 2013-04-01 ENCOUNTER — Encounter: Payer: Self-pay | Admitting: Obstetrics

## 2013-04-11 ENCOUNTER — Ambulatory Visit: Payer: Medicaid Other | Admitting: Obstetrics

## 2013-08-07 ENCOUNTER — Inpatient Hospital Stay (HOSPITAL_COMMUNITY)
Admission: AD | Admit: 2013-08-07 | Discharge: 2013-08-07 | Disposition: A | Discharge status: Home / Self Care | Payer: Medicaid Other | Source: Ambulatory Visit | Attending: Obstetrics & Gynecology | Admitting: Obstetrics & Gynecology

## 2013-08-07 DIAGNOSIS — Z3202 Encounter for pregnancy test, result negative: Secondary | ICD-10-CM | POA: Insufficient documentation

## 2013-08-07 DIAGNOSIS — N912 Amenorrhea, unspecified: Secondary | ICD-10-CM | POA: Insufficient documentation

## 2013-08-07 LAB — POCT PREGNANCY, URINE: PREG TEST UR: NEGATIVE

## 2013-08-07 NOTE — MAU Provider Note (Signed)
Kimberly [name] is a 23 y.o. [id number] who presents to MAU today with complaint of amenorrhea and desires UPT. The patient states that she was given Depo Provera injection after last delivery and missed her second dose in December. She has not had any bleeding since and was concerned about possible pregnancy. She denies pain or bleeding today. She has not taken HPT. She denies any other issues or concerns.   BP 144/93  Pulse 90  Resp 18  Ht 5\' 3"  (1.6 m)  Wt 225 lb (102.059 kg)  BMI 39.87 kg/m2  LMP 05/18/2013 GENERAL: Well-developed, well-nourished female in no acute distress.  HEENT: Normocephalic, atraumatic.   LUNGS: Effort normal HEART: Regular rate  SKIN: Warm, dry and without erythema PSYCH: Normal mood and affect  Results for orders placed during the hospital encounter of 08/07/13 (from the past 24 hour(s))  POCT PREGNANCY, URINE     Status: None   Collection Time    08/07/13  5:19 PM      Result Value Range   Preg Test, Ur NEGATIVE  NEGATIVE    MDM Discussed with patient the lasting effects of Depo Provera and possible side effects of amenorrhea even after the injection has expired Patient voiced understanding.  Advised patient to wait at least 2 weeks and take HPT if still no period and follow-up with [facility] as needed  A: Negative pregnancy test Amenorrhea  P: Discharge home Patient advised to take HPT in at least 2 weeks and follow-up accordingly Patient advised to follow-up with [facility] PRN Patient may return to MAU as needed or if her condition were to change or worsen  Freddi StarrJulie N Ethier, PA-C 08/07/2013 5:36 PM

## 2013-08-07 NOTE — MAU Note (Signed)
Pt presents for pregnancy test to find out if she is pregnant. Denies any pain of bleeding

## 2014-04-02 ENCOUNTER — Other Ambulatory Visit: Payer: Medicaid Other

## 2014-05-19 ENCOUNTER — Encounter: Payer: Self-pay | Admitting: Obstetrics

## 2014-06-02 ENCOUNTER — Inpatient Hospital Stay (HOSPITAL_COMMUNITY)
Admission: AD | Admit: 2014-06-02 | Discharge: 2014-06-02 | Disposition: A | Discharge status: Home / Self Care | Payer: Medicaid Other | Source: Ambulatory Visit | Attending: Obstetrics and Gynecology | Admitting: Obstetrics and Gynecology

## 2014-06-02 ENCOUNTER — Encounter (HOSPITAL_COMMUNITY): Payer: Self-pay

## 2014-06-02 DIAGNOSIS — O368191 Decreased fetal movements, unspecified trimester, fetus 1: Secondary | ICD-10-CM

## 2014-06-02 DIAGNOSIS — O368120 Decreased fetal movements, second trimester, not applicable or unspecified: Secondary | ICD-10-CM | POA: Insufficient documentation

## 2014-06-02 DIAGNOSIS — Z3A27 27 weeks gestation of pregnancy: Secondary | ICD-10-CM | POA: Insufficient documentation

## 2014-06-02 DIAGNOSIS — R109 Unspecified abdominal pain: Secondary | ICD-10-CM | POA: Insufficient documentation

## 2014-06-02 DIAGNOSIS — O368190 Decreased fetal movements, unspecified trimester, not applicable or unspecified: Secondary | ICD-10-CM

## 2014-06-02 HISTORY — DX: Gestational (pregnancy-induced) hypertension without significant proteinuria, unspecified trimester: O13.9

## 2014-06-02 HISTORY — DX: Major depressive disorder, single episode, unspecified: F32.9

## 2014-06-02 HISTORY — DX: Anxiety disorder, unspecified: F41.9

## 2014-06-02 HISTORY — DX: Essential (primary) hypertension: I10

## 2014-06-02 HISTORY — DX: Depression, unspecified: F32.A

## 2014-06-02 LAB — URINALYSIS, ROUTINE W REFLEX MICROSCOPIC
Bilirubin Urine: NEGATIVE
Glucose, UA: NEGATIVE mg/dL
HGB URINE DIPSTICK: NEGATIVE
Ketones, ur: NEGATIVE mg/dL
Leukocytes, UA: NEGATIVE
NITRITE: NEGATIVE
PROTEIN: NEGATIVE mg/dL
SPECIFIC GRAVITY, URINE: 1.02 (ref 1.005–1.030)
UROBILINOGEN UA: 2 mg/dL — AB (ref 0.0–1.0)
pH: 7 (ref 5.0–8.0)

## 2014-06-02 NOTE — MAU Provider Note (Signed)
Chief Complaint:  Possible Pregnancy and Abdominal Pain   First Provider Initiated Contact with Patient 06/02/14 1435      HPI: Kimberly [name] is a 23 y.o. G3P2001 at 1237w4d who presents to maternity admissions reporting abdominal pain.  States that she has been very nervous because she had a spontaneous abruption and IUFD at 38 wks with last pregnancy. She has been nervous re: this pregnancy due to this.that far along (she calculates she is ~ 27 wks).   Unsure of LMP, has not received PNC yet. Thinks her LMP is around May - June, but feels like when she was this far along she felt more FM and more "strong" FM. Also endorses some lower pelvic pressure. No discomfort, just "pressure."  Denies contractions, leakage of fluid or vaginal bleeding. Good fetal movement.   Pregnancy Course:  No PNC H/o IUFD w/ abruption at 2138 wks  Past Medical History: Past Medical History  Diagnosis Date  . Gonorrhea   . Chlamydia   . Constipation   . Hypertension   . Pregnancy induced hypertension   . Anxiety     because of last preg,scared something is wrong  . Depression     after loss of preg/child    Past obstetric history: OB History  Gravida Para Term Preterm AB SAB TAB Ectopic Multiple Living  3 2 2  0 0 0 0 0 0 1    # Outcome Date GA Lbr Len/2nd Weight Sex Delivery Anes PTL Lv  3 Current           2 Term 03/20/13 5712w4d  5 lb 3.8 oz (2.376 kg)  CS-LVertical Gen  ND  1 Term 11/24/08 2935w0d  6 lb (2.722 kg) M Vag-Spont EPI  Y     Comments: No complications      Past Surgical History: Past Surgical History  Procedure Laterality Date  . Cesarean section N/A 03/20/2013    Procedure: CESAREAN SECTION;  Surgeon: Kathreen CosierBernard A Marshall, MD;  Location: [facility];  Service: Obstetrics;  Laterality: N/A;     Family History: Family History  Problem Relation Age of Onset  . Cancer Neg Hx   . Diabetes Neg Hx   . Heart disease Neg Hx   . Hypertension Neg Hx   . Stroke Neg Hx   . Hearing loss Neg Hx    . Asthma Son     Social History: History  Substance Use Topics  . Smoking status: Never Smoker   . Smokeless tobacco: Never Used  . Alcohol Use: No    Allergies: No Known Allergies  Meds:  Prescriptions prior to admission  Medication Sig Dispense Refill Last Dose  . Prenatal Vit-Fe Fumarate-FA (PRENATAL MULTIVITAMIN) TABS tablet Take 1 tablet by mouth daily at 12 noon.   06/01/2014 at Unknown time  . ibuprofen (ADVIL,MOTRIN) 800 MG tablet Take 1 tablet (800 mg total) by mouth every 8 (eight) hours as needed for pain. (Patient not taking: Reported on 06/02/2014) 60 tablet 1   . medroxyPROGESTERone (DEPO-PROVERA) 150 MG/ML injection Inject 1 mL (150 mg total) into the muscle every 3 (three) months. (Patient not taking: Reported on 06/02/2014) 1 mL 4 Taking  . oxyCODONE-acetaminophen (PERCOCET) 10-325 MG per tablet Take 1 tablet by mouth every 6 (six) hours as needed for pain. (Patient not taking: Reported on 06/02/2014) 40 tablet 0   . Prenat-FeCbn-FeAspGl-FA-Omega (OB COMPLETE PETITE) 35-5-1-200 MG CAPS Take 1 capsule by mouth daily before breakfast. (Patient not taking: Reported on 06/02/2014) 90 capsule  3 Taking  . triamterene-hydrochlorothiazide (DYAZIDE) 37.5-25 MG per capsule Take 1 each (1 capsule total) by mouth every morning. (Patient not taking: Reported on 06/02/2014) 30 capsule 0   . zolpidem (AMBIEN) 5 MG tablet Take 1 tablet (5 mg total) by mouth at bedtime as needed for sleep. (Patient not taking: Reported on 06/02/2014) 30 tablet 0 Not Taking    ROS: Pertinent findings in history of present illness.  Physical Exam  Blood pressure 96/67, pulse 87, temperature 98.6 F (37 C), temperature source Oral, resp. rate 16, height 5' 4.5" (1.638 m), weight 223 lb 12.8 oz (101.515 kg), last menstrual period 11/21/2013, SpO2 98 %. GENERAL: Well-developed, well-nourished female in no acute distress.  HEENT: normocephalic HEART: normal rate RESP: normal effort ABDOMEN: Soft,  non-tender, gravid with uterus below umbilicus EXTREMITIES: Nontender, no edema NEURO: alert and oriented SPECULUM EXAM: NEFG, physiologic discharge, no blood, cervix clean  SVE: c/t/h  FHT:  Baseline 150 , moderate variability, accelerations present, no decelerations Contractions: quiet   Labs: Results for orders placed or performed during the hospital encounter of 06/02/14 (from the past 24 hour(s))  Urinalysis, Routine w reflex microscopic     Status: Abnormal   Collection Time: 06/02/14 12:19 PM  Result Value Ref Range   Color, Urine YELLOW YELLOW   APPearance HAZY (A) CLEAR   Specific Gravity, Urine 1.020 1.005 - 1.030   pH 7.0 5.0 - 8.0   Glucose, UA NEGATIVE NEGATIVE mg/dL   Hgb urine dipstick NEGATIVE NEGATIVE   Bilirubin Urine NEGATIVE NEGATIVE   Ketones, ur NEGATIVE NEGATIVE mg/dL   Protein, ur NEGATIVE NEGATIVE mg/dL   Urobilinogen, UA 2.0 (H) 0.0 - 1.0 mg/dL   Nitrite NEGATIVE NEGATIVE   Leukocytes, UA NEGATIVE NEGATIVE    Imaging:  No results found. MAU Course:   Assessment: 1. Decreased fetal movement, unspecified trimester, fetus 1     Plan: Per FH, feels more c/w ~ 20 weeks if not less. Estimate GA likely not 27 wks as pt calculated given unsure LMP. Advised likely lack of "dramatic" FM due to less than expected GA. Reactive NST Likely lower pelvic pressure 2/2 round ligament pain, regular uterine growth in pregnancy. Set up in [facility] clinic in Dec. Ordered outpatient dating and anatomy scan Discharge home Labor precautions and abruption precautions     Medication List    STOP taking these medications        ibuprofen 800 MG tablet  Commonly known as:  ADVIL,MOTRIN     medroxyPROGESTERone 150 MG/ML injection  Commonly known as:  DEPO-PROVERA     oxyCODONE-acetaminophen 10-325 MG per tablet  Commonly known as:  PERCOCET     triamterene-hydrochlorothiazide 37.5-25 MG per capsule  Commonly known as:  DYAZIDE      TAKE these medications         OB COMPLETE PETITE 35-5-1-200 MG Caps  Take 1 capsule by mouth daily before breakfast.     prenatal multivitamin Tabs tablet  Take 1 tablet by mouth daily at 12 noon.     zolpidem 5 MG tablet  Commonly known as:  AMBIEN  Take 1 tablet (5 mg total) by mouth at bedtime as needed for sleep.        Ethelda Chickaroline Stryker Veasey, MD 06/02/2014 2:37 PM

## 2014-06-02 NOTE — MAU Note (Signed)
Patient states she has had a positive home pregnancy test. States she thinks she is about 6 months. States she has felt fetal movement at times. Denies bleeding, discharge, nausea, vomiting or diarrhea. Has been having abdominal pain. Had an emergency C/S with last pregnancy for abruption and a stillbirth.

## 2014-06-19 ENCOUNTER — Encounter: Payer: Medicaid Other | Admitting: Family

## 2014-06-19 ENCOUNTER — Encounter: Payer: Self-pay | Admitting: Family

## 2014-06-25 ENCOUNTER — Ambulatory Visit (HOSPITAL_COMMUNITY)
Admission: RE | Admit: 2014-06-25 | Discharge: 2014-06-25 | Disposition: A | Discharge status: Home / Self Care | Payer: Medicaid Other | Source: Ambulatory Visit | Attending: Obstetrics and Gynecology | Admitting: Obstetrics and Gynecology

## 2014-06-25 DIAGNOSIS — Z3689 Encounter for other specified antenatal screening: Secondary | ICD-10-CM | POA: Insufficient documentation

## 2014-06-25 DIAGNOSIS — O09299 Supervision of pregnancy with other poor reproductive or obstetric history, unspecified trimester: Secondary | ICD-10-CM | POA: Insufficient documentation

## 2014-06-25 DIAGNOSIS — O09292 Supervision of pregnancy with other poor reproductive or obstetric history, second trimester: Secondary | ICD-10-CM | POA: Insufficient documentation

## 2014-06-25 DIAGNOSIS — Z3A24 24 weeks gestation of pregnancy: Secondary | ICD-10-CM | POA: Diagnosis not present

## 2014-06-25 DIAGNOSIS — Z36 Encounter for antenatal screening of mother: Secondary | ICD-10-CM | POA: Diagnosis not present

## 2014-06-25 DIAGNOSIS — O3421 Maternal care for scar from previous cesarean delivery: Secondary | ICD-10-CM | POA: Diagnosis not present

## 2014-06-25 DIAGNOSIS — O368191 Decreased fetal movements, unspecified trimester, fetus 1: Secondary | ICD-10-CM

## 2014-06-25 DIAGNOSIS — O0932 Supervision of pregnancy with insufficient antenatal care, second trimester: Secondary | ICD-10-CM | POA: Insufficient documentation

## 2014-06-26 ENCOUNTER — Telehealth: Payer: Self-pay

## 2014-06-26 NOTE — Telephone Encounter (Signed)
-----   Message from Earley BrookeNicole S Dalrymple sent at 06/25/2014  2:27 PM EST ----- Regarding: US results   We have just completed a 2nd or 3rd trimester outpatient ultrasound scheduled for a patient who was seen in MAU.  Please call the patient with the results.

## 2014-06-26 NOTE — Telephone Encounter (Signed)
Attempted to contact patient. Straight to voicemail. Left message we are calling with results, please call clinic.

## 2014-07-01 NOTE — Telephone Encounter (Signed)
Attempted to contact patient. Straight to voicemail. Left message stating we are calling with results, please call clinic. Will send letter informing her of dating and need to start care if not already done so.

## 2014-07-18 NOTE — L&D Delivery Note (Signed)
Delivery Note At 4:20 PM a viable female was delivered via VBAC, Spontaneous (Presentation: Left Occiput Anterior).  APGAR: 8, 9; weight 5 lb 8.7 oz (2515 g).   Placenta status: Intact, Spontaneous.  Cord: 3 vessels with the following complications: None.  Cord pH:   Anesthesia: Epidural  Episiotomy: None Lacerations: None Suture Repair:  Est. Blood Loss (mL): 300  Mom to postpartum.  Baby to Couplet care / Skin to Skin.  Kimberly [name] H 09/21/2014, 7:17 PM

## 2014-07-29 ENCOUNTER — Encounter: Payer: Self-pay | Admitting: General Practice

## 2014-09-08 ENCOUNTER — Encounter: Payer: Self-pay | Admitting: Obstetrics and Gynecology

## 2014-09-08 ENCOUNTER — Other Ambulatory Visit (HOSPITAL_COMMUNITY)
Admission: RE | Admit: 2014-09-08 | Discharge: 2014-09-08 | Disposition: A | Discharge status: Home / Self Care | Payer: Medicaid Other | Source: Ambulatory Visit | Attending: Obstetrics and Gynecology | Admitting: Obstetrics and Gynecology

## 2014-09-08 ENCOUNTER — Ambulatory Visit (INDEPENDENT_AMBULATORY_CARE_PROVIDER_SITE_OTHER): Payer: Medicaid Other | Admitting: Obstetrics and Gynecology

## 2014-09-08 VITALS — BP 131/78 | HR 95 | Temp 98.0°F | Wt 247.7 lb

## 2014-09-08 DIAGNOSIS — Z113 Encounter for screening for infections with a predominantly sexual mode of transmission: Secondary | ICD-10-CM | POA: Diagnosis present

## 2014-09-08 DIAGNOSIS — Z01419 Encounter for gynecological examination (general) (routine) without abnormal findings: Secondary | ICD-10-CM | POA: Insufficient documentation

## 2014-09-08 DIAGNOSIS — Z8759 Personal history of other complications of pregnancy, childbirth and the puerperium: Secondary | ICD-10-CM

## 2014-09-08 DIAGNOSIS — O0933 Supervision of pregnancy with insufficient antenatal care, third trimester: Secondary | ICD-10-CM

## 2014-09-08 DIAGNOSIS — O09293 Supervision of pregnancy with other poor reproductive or obstetric history, third trimester: Secondary | ICD-10-CM

## 2014-09-08 DIAGNOSIS — O0932 Supervision of pregnancy with insufficient antenatal care, second trimester: Secondary | ICD-10-CM

## 2014-09-08 LAB — POCT URINALYSIS DIP (DEVICE)
BILIRUBIN URINE: NEGATIVE
Glucose, UA: NEGATIVE mg/dL
KETONES UR: NEGATIVE mg/dL
LEUKOCYTES UA: NEGATIVE
Nitrite: NEGATIVE
PH: 6 (ref 5.0–8.0)
Protein, ur: NEGATIVE mg/dL
Specific Gravity, Urine: 1.02 (ref 1.005–1.030)
Urobilinogen, UA: 0.2 mg/dL (ref 0.0–1.0)

## 2014-09-08 LAB — OB RESULTS CONSOLE GC/CHLAMYDIA
Chlamydia: NEGATIVE
Gonorrhea: NEGATIVE

## 2014-09-08 LAB — OB RESULTS CONSOLE GBS: GBS: POSITIVE

## 2014-09-08 NOTE — Addendum Note (Signed)
Addended by: Sherre LainASH, Ilithyia Titzer A on: 09/08/2014 03:53 PM   Modules accepted: Orders

## 2014-09-08 NOTE — Progress Notes (Signed)
Reports edema in hands and feet.  C/o intermittent pelvic pressure/pain.  Prenatal profile and 1hr gtt today. Pap with cultures today. Patient to consider tdap and flu.

## 2014-09-08 NOTE — Progress Notes (Signed)
[id number] at 3462w3d for initial prenatal visit. Late to prenatal care. No concerns or complaints today.  1. History of elevated blood pressure with prior pregnancy. Blood pressure at goal today.  2. History of IUFD at term. Will need twice weekly NST and delivery at 39 weeks. Discussed with patient. Will start twice weekly NST this week as already at [redacted] weeks gestation.  3. Routine PNC. Initial labs and 1 hour GCT today. Pap smear with 36 week cultures done.

## 2014-09-09 LAB — COMPLETE METABOLIC PANEL WITH GFR
ALBUMIN: 3.2 g/dL — AB (ref 3.5–5.2)
ALT: 13 U/L (ref 0–35)
AST: 14 U/L (ref 0–37)
Alkaline Phosphatase: 91 U/L (ref 39–117)
BILIRUBIN TOTAL: 0.2 mg/dL (ref 0.2–1.2)
BUN: 6 mg/dL (ref 6–23)
CALCIUM: 9.2 mg/dL (ref 8.4–10.5)
CHLORIDE: 105 meq/L (ref 96–112)
CO2: 19 mEq/L (ref 19–32)
CREATININE: 0.55 mg/dL (ref 0.50–1.10)
GLUCOSE: 126 mg/dL — AB (ref 70–99)
POTASSIUM: 3.8 meq/L (ref 3.5–5.3)
SODIUM: 137 meq/L (ref 135–145)
Total Protein: 6.2 g/dL (ref 6.0–8.3)

## 2014-09-09 LAB — PROTEIN / CREATININE RATIO, URINE
CREATININE, URINE: 129.1 mg/dL
Protein Creatinine Ratio: 0.14 (ref <0.15)
TOTAL PROTEIN, URINE: 18 mg/dL (ref 5–24)

## 2014-09-09 LAB — PRENATAL PROFILE (SOLSTAS)
ANTIBODY SCREEN: NEGATIVE
BASOS ABS: 0 10*3/uL (ref 0.0–0.1)
BASOS PCT: 0 % (ref 0–1)
EOS ABS: 0 10*3/uL (ref 0.0–0.7)
Eosinophils Relative: 0 % (ref 0–5)
HEMATOCRIT: 32.5 % — AB (ref 36.0–46.0)
HEP B S AG: NEGATIVE
HIV 1&2 Ab, 4th Generation: NONREACTIVE
Hemoglobin: 11 g/dL — ABNORMAL LOW (ref 12.0–15.0)
LYMPHS ABS: 1.9 10*3/uL (ref 0.7–4.0)
Lymphocytes Relative: 18 % (ref 12–46)
MCH: 29.9 pg (ref 26.0–34.0)
MCHC: 33.8 g/dL (ref 30.0–36.0)
MCV: 88.3 fL (ref 78.0–100.0)
MONOS PCT: 8 % (ref 3–12)
MPV: 11.8 fL (ref 8.6–12.4)
Monocytes Absolute: 0.9 10*3/uL (ref 0.1–1.0)
NEUTROS PCT: 74 % (ref 43–77)
Neutro Abs: 8 10*3/uL — ABNORMAL HIGH (ref 1.7–7.7)
PLATELETS: 222 10*3/uL (ref 150–400)
RBC: 3.68 MIL/uL — AB (ref 3.87–5.11)
RDW: 13.2 % (ref 11.5–15.5)
RUBELLA: 0.78 {index} (ref <0.90)
Rh Type: POSITIVE
WBC: 10.8 10*3/uL — AB (ref 4.0–10.5)

## 2014-09-09 LAB — CULTURE, OB URINE
COLONY COUNT: NO GROWTH
ORGANISM ID, BACTERIA: NO GROWTH

## 2014-09-09 LAB — GLUCOSE TOLERANCE, 1 HOUR (50G) W/O FASTING: GLUCOSE 1 HOUR GTT: 134 mg/dL (ref 70–140)

## 2014-09-09 LAB — CULTURE, BETA STREP (GROUP B ONLY)

## 2014-09-10 LAB — PRESCRIPTION MONITORING PROFILE (19 PANEL)
AMPHETAMINE/METH: NEGATIVE ng/mL
BARBITURATE SCREEN, URINE: NEGATIVE ng/mL
BUPRENORPHINE, URINE: NEGATIVE ng/mL
Benzodiazepine Screen, Urine: NEGATIVE ng/mL
CANNABINOID SCRN UR: NEGATIVE ng/mL
Carisoprodol, Urine: NEGATIVE ng/mL
Cocaine Metabolites: NEGATIVE ng/mL
Creatinine, Urine: 129.02 mg/dL (ref >20.0)
FENTANYL URINE: NEGATIVE ng/mL
MDMA URINE: NEGATIVE ng/mL
MEPERIDINE UR: NEGATIVE ng/mL
METHADONE SCREEN, URINE: NEGATIVE ng/mL
METHAQUALONE SCREEN (URINE): NEGATIVE ng/mL
NITRITES URINE, INITIAL: NEGATIVE ug/mL
OPIATE SCREEN, URINE: NEGATIVE ng/mL
Oxycodone Screen, Ur: NEGATIVE ng/mL
PROPOXYPHENE: NEGATIVE ng/mL
Phencyclidine, Ur: NEGATIVE ng/mL
TAPENTADOLUR: NEGATIVE ng/mL
Tramadol Scrn, Ur: NEGATIVE ng/mL
Zolpidem, Urine: NEGATIVE ng/mL
pH, Initial: 6.3 pH (ref 4.5–8.9)

## 2014-09-10 LAB — HEMOGLOBINOPATHY EVALUATION
Hemoglobin Other: 0 %
Hgb A2 Quant: 2.8 % (ref 2.2–3.2)
Hgb A: 97.2 % (ref 96.8–97.8)
Hgb F Quant: 0 % (ref 0.0–2.0)
Hgb S Quant: 0 %

## 2014-09-10 LAB — GC/CHLAMYDIA PROBE AMP

## 2014-09-10 LAB — CYTOLOGY - PAP

## 2014-09-11 ENCOUNTER — Other Ambulatory Visit: Payer: Medicaid Other

## 2014-09-11 ENCOUNTER — Encounter: Payer: Self-pay | Admitting: *Deleted

## 2014-09-11 ENCOUNTER — Telehealth: Payer: Self-pay | Admitting: *Deleted

## 2014-09-11 NOTE — Telephone Encounter (Signed)
Attempted to contact patient, no answer, left message for patient to contact the clinic to reschedule important testing appointment.  Will send letter.  Letter sent.

## 2014-09-15 ENCOUNTER — Encounter: Payer: Self-pay | Admitting: Obstetrics and Gynecology

## 2014-09-15 ENCOUNTER — Other Ambulatory Visit: Payer: Medicaid Other

## 2014-09-15 DIAGNOSIS — O0993 Supervision of high risk pregnancy, unspecified, third trimester: Secondary | ICD-10-CM | POA: Insufficient documentation

## 2014-09-16 ENCOUNTER — Ambulatory Visit (HOSPITAL_COMMUNITY): Admission: RE | Admit: 2014-09-16 | Payer: Medicaid Other | Source: Ambulatory Visit

## 2014-09-20 ENCOUNTER — Encounter (HOSPITAL_COMMUNITY): Payer: Self-pay

## 2014-09-20 ENCOUNTER — Inpatient Hospital Stay (HOSPITAL_COMMUNITY)
Admission: AD | Admit: 2014-09-20 | Discharge: 2014-09-23 | DRG: 775 | Disposition: A | Discharge status: Home / Self Care | Payer: Medicaid Other | Source: Ambulatory Visit | Attending: Family Medicine | Admitting: Family Medicine

## 2014-09-20 ENCOUNTER — Inpatient Hospital Stay (HOSPITAL_COMMUNITY): Payer: Medicaid Other

## 2014-09-20 DIAGNOSIS — Z3493 Encounter for supervision of normal pregnancy, unspecified, third trimester: Secondary | ICD-10-CM

## 2014-09-20 DIAGNOSIS — O0933 Supervision of pregnancy with insufficient antenatal care, third trimester: Secondary | ICD-10-CM

## 2014-09-20 DIAGNOSIS — O99824 Streptococcus B carrier state complicating childbirth: Secondary | ICD-10-CM | POA: Diagnosis not present

## 2014-09-20 DIAGNOSIS — O3421 Maternal care for scar from previous cesarean delivery: Secondary | ICD-10-CM | POA: Diagnosis present

## 2014-09-20 DIAGNOSIS — Z3A37 37 weeks gestation of pregnancy: Secondary | ICD-10-CM | POA: Diagnosis present

## 2014-09-20 DIAGNOSIS — O09299 Supervision of pregnancy with other poor reproductive or obstetric history, unspecified trimester: Secondary | ICD-10-CM

## 2014-09-20 DIAGNOSIS — O163 Unspecified maternal hypertension, third trimester: Secondary | ICD-10-CM | POA: Diagnosis present

## 2014-09-20 DIAGNOSIS — O133 Gestational [pregnancy-induced] hypertension without significant proteinuria, third trimester: Principal | ICD-10-CM | POA: Diagnosis present

## 2014-09-20 LAB — URINALYSIS, ROUTINE W REFLEX MICROSCOPIC
Bilirubin Urine: NEGATIVE
Glucose, UA: NEGATIVE mg/dL
Ketones, ur: NEGATIVE mg/dL
Leukocytes, UA: NEGATIVE
NITRITE: NEGATIVE
PH: 6 (ref 5.0–8.0)
PROTEIN: NEGATIVE mg/dL
UROBILINOGEN UA: 0.2 mg/dL (ref 0.0–1.0)

## 2014-09-20 LAB — URINE MICROSCOPIC-ADD ON

## 2014-09-20 LAB — CBC WITH DIFFERENTIAL/PLATELET
BASOS ABS: 0 10*3/uL (ref 0.0–0.1)
Basophils Relative: 0 % (ref 0–1)
Eosinophils Absolute: 0.1 10*3/uL (ref 0.0–0.7)
Eosinophils Relative: 1 % (ref 0–5)
HCT: 36.2 % (ref 36.0–46.0)
HEMOGLOBIN: 12.5 g/dL (ref 12.0–15.0)
LYMPHS PCT: 20 % (ref 12–46)
Lymphs Abs: 2.4 10*3/uL (ref 0.7–4.0)
MCH: 30.3 pg (ref 26.0–34.0)
MCHC: 34.5 g/dL (ref 30.0–36.0)
MCV: 87.7 fL (ref 78.0–100.0)
MONOS PCT: 8 % (ref 3–12)
Monocytes Absolute: 0.9 10*3/uL (ref 0.1–1.0)
NEUTROS ABS: 8.7 10*3/uL — AB (ref 1.7–7.7)
Neutrophils Relative %: 71 % (ref 43–77)
Platelets: 261 10*3/uL (ref 150–400)
RBC: 4.13 MIL/uL (ref 3.87–5.11)
RDW: 12.9 % (ref 11.5–15.5)
WBC: 12.1 10*3/uL — AB (ref 4.0–10.5)

## 2014-09-20 LAB — COMPREHENSIVE METABOLIC PANEL
ALT: 16 U/L (ref 0–35)
AST: 19 U/L (ref 0–37)
Albumin: 3.1 g/dL — ABNORMAL LOW (ref 3.5–5.2)
Alkaline Phosphatase: 117 U/L (ref 39–117)
Anion gap: 9 (ref 5–15)
BILIRUBIN TOTAL: 0.4 mg/dL (ref 0.3–1.2)
BUN: 10 mg/dL (ref 6–23)
CALCIUM: 9.5 mg/dL (ref 8.4–10.5)
CHLORIDE: 102 mmol/L (ref 96–112)
CO2: 25 mmol/L (ref 19–32)
CREATININE: 0.59 mg/dL (ref 0.50–1.10)
GFR calc Af Amer: >90 mL/min (ref >90)
GFR calc non Af Amer: >90 mL/min (ref >90)
Glucose, Bld: 77 mg/dL (ref 70–99)
POTASSIUM: 4.3 mmol/L (ref 3.5–5.1)
SODIUM: 136 mmol/L (ref 135–145)
TOTAL PROTEIN: 6.6 g/dL (ref 6.0–8.3)

## 2014-09-20 LAB — PROTEIN / CREATININE RATIO, URINE
Creatinine, Urine: 284 mg/dL
PROTEIN CREATININE RATIO: 0.05 (ref 0.00–0.15)
Total Protein, Urine: 15 mg/dL

## 2014-09-20 MED ORDER — CITRIC ACID-SODIUM CITRATE 334-500 MG/5ML PO SOLN: 30.0000 mL | ORAL | Status: DC | PRN

## 2014-09-20 MED ORDER — FENTANYL CITRATE 0.05 MG/ML IJ SOLN
100.0000 ug | INTRAMUSCULAR | Status: DC | PRN
  Administered 2014-09-21 (×3): 100 ug via INTRAVENOUS
  Filled 2014-09-20 (×3): qty 2

## 2014-09-20 MED ORDER — ONDANSETRON HCL 4 MG/2ML IJ SOLN: 4.0000 mg | Freq: Four times a day (QID) | INTRAMUSCULAR | Status: DC | PRN

## 2014-09-20 MED ORDER — TERBUTALINE SULFATE 1 MG/ML IJ SOLN: 0.2500 mg | Freq: Once | INTRAMUSCULAR | Status: DC | PRN

## 2014-09-20 MED ORDER — LIDOCAINE HCL (PF) 1 % IJ SOLN
30.0000 mL | INTRAMUSCULAR | Status: DC | PRN
  Filled 2014-09-20: qty 30

## 2014-09-20 MED ORDER — OXYCODONE-ACETAMINOPHEN 5-325 MG PO TABS: 1.0000 | ORAL_TABLET | ORAL | Status: DC | PRN

## 2014-09-20 MED ORDER — OXYTOCIN 40 UNITS IN LACTATED RINGERS INFUSION - SIMPLE MED
62.5000 mL/h | INTRAVENOUS | Status: DC
Start: 2014-09-20 — End: 2014-09-20

## 2014-09-20 MED ORDER — OXYTOCIN BOLUS FROM INFUSION
500.0000 mL | INTRAVENOUS | Status: DC
  Administered 2014-09-21: 500 mL via INTRAVENOUS

## 2014-09-20 MED ORDER — MISOPROSTOL 25 MCG QUARTER TABLET: 50.0000 ug | ORAL_TABLET | Freq: Once | ORAL | Status: DC

## 2014-09-20 MED ORDER — OXYCODONE-ACETAMINOPHEN 5-325 MG PO TABS: 2.0000 | ORAL_TABLET | ORAL | Status: DC | PRN

## 2014-09-20 MED ORDER — ACETAMINOPHEN 325 MG PO TABS
650.0000 mg | ORAL_TABLET | ORAL | Status: DC | PRN
  Administered 2014-09-21: 650 mg via ORAL
  Filled 2014-09-20: qty 2

## 2014-09-20 MED ORDER — OXYTOCIN 40 UNITS IN LACTATED RINGERS INFUSION - SIMPLE MED
1.0000 m[IU]/min | INTRAVENOUS | Status: DC
  Administered 2014-09-20: 2 m[IU]/min via INTRAVENOUS
  Administered 2014-09-21: 20 m[IU]/min via INTRAVENOUS
  Filled 2014-09-20: qty 1000

## 2014-09-20 MED ORDER — ZOLPIDEM TARTRATE 5 MG PO TABS
5.0000 mg | ORAL_TABLET | Freq: Every evening | ORAL | Status: DC | PRN
  Administered 2014-09-20: 5 mg via ORAL
  Filled 2014-09-20: qty 1

## 2014-09-20 MED ORDER — TERBUTALINE SULFATE 1 MG/ML IJ SOLN: 0.2500 mg | Freq: Once | INTRAMUSCULAR | Status: AC | PRN

## 2014-09-20 MED ORDER — LACTATED RINGERS IV SOLN
INTRAVENOUS | Status: DC
Start: 2014-09-20 — End: 2014-09-21
  Administered 2014-09-20 – 2014-09-21 (×3): via INTRAVENOUS

## 2014-09-20 MED ORDER — LACTATED RINGERS IV SOLN: 500.0000 mL | INTRAVENOUS | Status: DC | PRN

## 2014-09-20 NOTE — MAU Provider Note (Signed)
History     CSN: [id number]  Arrival date and time: 09/20/14 1023   None     Chief Complaint  Patient presents with  . Labor Eval   HPI   Patient is 24 y.o. [id number] 6480w1d with h/o fetal demise >28wks, here with abdominal pain since 4 am this morning.  She reports that the pain started right before she got out of bed, and worsened after moving around.   Pain is intermittent, lasting 15-20 minutes, occasionally resolving.  Pain located upper abdomen, radiates around to back b/l.  She reports having sex last night, reports some discharge per vagina last night, no blood, small amount, consistent with semen.  No probs with blood pressure this pregnancy, but had gHTN during first pregnancy and BP is slightly elevated at 145/85 today.    Previous pregnancy ended w/abruption/CS/fetal demise >28 wks, plan for VBAC this pregnancy, h/o late presentation to care at 27 wks with inconsistent appointments since that time.  She denies headaches, dizziness, vision changes, mild LE edema stable throughout pregnancy.   [facility]  Dating  Blood type:   Genetic Screen Too late Antibody:   Anatomic US  Rubella:   [facility]: 135 RPR:   Flu vaccine  HBsAg:   TDaP vaccine  Rhogam: HIV:   GBS Positive  GBS:   Contraception  Pap:  Baby Food    Circumcision    Pediatrician    Support Person         Past Medical History  Diagnosis Date  . Gonorrhea   . Chlamydia   . Constipation   . Hypertension   . Pregnancy induced hypertension   . Anxiety     because of last preg,scared something is wrong  . Depression     after loss of preg/child    Past Surgical History  Procedure Laterality Date  . Cesarean section N/A 03/20/2013    Procedure: CESAREAN SECTION;  Surgeon: Kathreen CosierBernard A Marshall, MD;  Location: [facility];  Service: Obstetrics;  Laterality: N/A;    Family  History  Problem Relation Age of Onset  . Cancer Neg Hx   . Diabetes Neg Hx   . Heart disease Neg Hx   . Hypertension Neg Hx   . Stroke Neg Hx   . Hearing loss Neg Hx   . Asthma Son     History  Substance Use Topics  . Smoking status: Never Smoker   . Smokeless tobacco: Never Used  . Alcohol Use: No    Allergies: No Known Allergies  Prescriptions prior to admission  Medication Sig Dispense Refill Last Dose  . Prenatal Vit-Fe Fumarate-FA (PRENATAL MULTIVITAMIN) TABS tablet Take 1 tablet by mouth daily at 12 noon.   09/19/2014 at Unknown time  . Prenat-FeCbn-FeAspGl-FA-Omega (OB COMPLETE PETITE) 35-5-1-200 MG CAPS Take 1 capsule by mouth daily before breakfast. (Patient not taking: Reported on 06/02/2014) 90 capsule 3 Not Taking  . zolpidem (AMBIEN) 5 MG tablet Take 1 tablet (5 mg total) by mouth at bedtime as needed for sleep. (Patient not taking: Reported on 06/02/2014) 30 tablet 0 prn    Review of Systems  Constitutional: Negative for fever and chills.  Eyes: Negative for blurred vision and double vision.  Respiratory: Negative for cough and shortness of breath.   Cardiovascular: Negative for chest pain, palpitations and leg swelling.  Gastrointestinal: Positive for abdominal pain. Negative for nausea, vomiting and diarrhea.  Genitourinary: Negative for dysuria and hematuria.  Musculoskeletal:  Positive for back pain. Negative for myalgias.  Skin: Negative for rash.  Neurological: Negative for dizziness, loss of consciousness and headaches.  Psychiatric/Behavioral: The patient is nervous/anxious.    Physical Exam   Blood pressure 138/80, pulse 105, temperature 98.2 F (36.8 C), temperature source Oral, resp. rate 18, last menstrual period 11/21/2013.  Physical Exam  Constitutional: She is oriented to person, place, and time. She appears well-developed and well-nourished. No distress.  HENT:  Head: Normocephalic and atraumatic.  Mouth/Throat: Oropharynx is clear and  moist.  Eyes: Conjunctivae and EOM are normal. Pupils are equal, round, and reactive to light.  Neck: Normal range of motion. Neck supple.  Cardiovascular: Normal rate and intact distal pulses.   Respiratory: Effort normal. No respiratory distress.  GI: Soft. She exhibits distension. There is no tenderness. There is no rebound and no guarding.  Genitourinary: Vagina normal and uterus normal.  Musculoskeletal: Normal range of motion. She exhibits no edema.  Neurological: She is alert and oriented to person, place, and time. Coordination normal.  Skin: Skin is warm and dry. She is not diaphoretic.  Psychiatric: She has a normal mood and affect. Her behavior is normal.    MAU Course  Procedures  BPP U/S for growth  Assessment and Plan  A: Patient is 24 y.o. G3P2001 [redacted]w[redacted]d here with complaints of abd pain     Possible ctx on monitor, irregular, FHR reassuring     Cervical exam reassuring at 1-2/0/-3     One elevated blood pressure, but asymptomatic  P:  CMP, CBC and Urine Pr/Cr, significant only for elevated spec gravity       BPP and growth u/s obtained given pts hx and irregular follow up; also reassuring; awaiting final read   Repeat BP found to be 157/90 >4hrs from prior elevated BP, Admitted to L&D for TOLAC     Sonia Stickels 09/20/2014, 12:31 PM

## 2014-09-20 NOTE — H&P (Signed)
Kimberly [name] is a 24 y.o. G3P2001 55w1dwith h/o fetal demise >28wks, here with abdominal pain since 4 am this morning. She reports that the pain started right before she got out of bed, and worsened after moving around. Pain is intermittent, lasting 15-20 minutes, occasionally resolving. Pain located upper abdomen, radiates around to back b/l. She reports having sex last night, reports some discharge per vagina last night, no blood, small amount, consistent with semen. No probs with blood pressure this pregnancy, but had gHTN during first pregnancy and BP is slightly elevated at 145/85 today.   Previous pregnancy ended w/abruption/CS/fetal demise >28 wks, plan for VBAC this pregnancy, h/o late presentation to care at 27 wks with inconsistent appointments since that time.  She denies headaches, dizziness, vision changes, mild LE edema stable throughout pregnancy.   [facility]  Dating  Blood type:   Genetic Screen Too late Antibody:   Anatomic [place] Rubella:   [facility] R[facility]:   Flu vaccine  HBsAg:   TDaP vaccine  Rhogam: HIV:   GBS Positive  GBS:   Contraception  Pap:  Baby Food    Circumcision    Pediatrician    Support Person              History OB History    Gravida Para Term Preterm AB TAB SAB Ectopic Multiple Living   3 2 2  0 0 0 0 0 0 1     Past Medical History  Diagnosis Date  . Gonorrhea   . Chlamydia   . Constipation   . Hypertension   . Pregnancy induced hypertension   . Anxiety     because of last preg,scared something is wrong  . Depression     after loss of preg/child   Past Surgical History  Procedure Laterality Date  . Cesarean section N/A 03/20/2013    Procedure: CESAREAN SECTION;  Surgeon: BFrederico Hamman MD;  [facility];  Service: Obstetrics;  Laterality: N/A;    Family History: family history includes Asthma in her son. There is no history of Cancer, Diabetes, Heart disease, Hypertension, Stroke, or Hearing loss. Social History:  reports that she has never smoked. She has never used smokeless tobacco. She reports that she does not drink alcohol or use illicit drugs.   Prenatal Transfer Tool  Maternal Diabetes: No Genetic Screening: Declined Maternal Ultrasounds/Referrals: Normal Fetal Ultrasounds or other Referrals:  None Maternal Substance Abuse:  No Significant Maternal Medications:  None Significant Maternal Lab Results:  None Other Comments:  only 1 prenatal visit, care established at [facility] had abnormal 1h gtt but did not follow up to get 3h gtt  ROS  Dilation: 1.5 Effacement (%): Thick Station: -3 Exam by:: SBeck, RN Blood pressure 157/90, pulse 96, temperature 98.2 F (36.8 C), temperature source Oral, resp. rate 18, last menstrual period 11/21/2013. Exam Physical Exam  Prenatal labs: ABO, Rh: B/POS/-- (02/22 1553) Antibody: NEG (02/22 1553) Rubella: 0.78 (02/22 1553) RPR: NON REAC (02/22 1553)  HBsAg: NEGATIVE (02/22 1553)  HIV: NONREACTIVE (02/22 1553)  GBS:     Assessment/Plan: Patient is 24y.o. G3P2001 327w1dere with abdominal pain likely secondary to round ligament pain admitted for IOL 2/2 gestational hypertension  Given hx of IUFD and no follow up BPP and growth sono ordered, subsequently met criteria for gHTN with elev BP 4h apart.  Labs negative. - IOL with FB, pit in 4h if needed at low dose  for cervical ripening  TOL: desires TOL, consents signed.    Risk of uterine rupture at term is 0.78 percent with TOLAC and 0.22 percent with ERCD. 1 in 10 uterine ruptures will result in neonatal death or neurological injury. The benefits of a trial of labor after cesarean (TOLAC) resulting in a vaginal birth after cesarean (VBAC) include the following: shorter length of hospital stay and postpartum recovery (in most  cases); fewer complications, such as postpartum fever, wound or uterine infection, thromboembolism (blood clots in the leg or lung), need for blood transfusion and fewer neonatal breathing problems. The risks of an attempted VBAC or TOLAC include the following: Risk of failed trial of labor after cesarean (TOLAC) without a vaginal birth after cesarean (VBAC) resulting in repeat cesarean delivery (RCD) in about 20 to 58 percent of women who attempt VBAC.  Risk of rupture of uterus resulting in an emergency cesarean delivery. The risk of uterine rupture may be related in part to the type of uterine incision made during the first cesarean delivery. A previous transverse uterine incision has the lowest risk of rupture (0.2 to 1.5 percent risk). Vertical or T-shaped uterine incisions have a higher risk of uterine rupture (4 to 9 percent risk)The risk of fetal death is very low with both VBAC and elective repeat cesarean delivery (ERCD), but the likelihood of fetal death is higher with VBAC than with ERCD. Maternal death is very rare with either type of delivery. The risks of an elective repeat cesarean delivery (ERCD) were reviewed with the patient including but not limited to: 08/998 risk of uterine rupture which could have serious consequences, bleeding which may require transfusion; infection which may require antibiotics; injury to bowel, bladder or other surrounding organs (bowel, bladder, ureters); injury to the fetus; need for additional procedures including hysterectomy in the event of a life-threatening hemorrhage; thromboembolic phenomenon; abnormal placentation; incisional problems; death and other postoperative or anesthesia complications.    These risks and benefits are summarized on the consent form, which was reviewed with the patient during the visit.  All her questions answered and she signed a consent indicating a preference for TOLAC/ERCD. A copy of the consent was given to the  patient.  #limited prenatal care: UDS, SW consult  Henson,Amber 09/20/2014, 3:38 PM

## 2014-09-20 NOTE — Progress Notes (Signed)
S: comfortable, no complaints O: FB still on pelvic exam  Cat I strip  BP 144/77 mmHg  Pulse 96  Temp(Src) 98.5 F (36.9 C) (Oral)  Resp 20  Ht 5' 4.5" (1.638 m)  Wt 250 lb (113.399 kg)  BMI 42.27 kg/m2  LMP 11/21/2013   A: G3P2001 2862w1d here for IOL 2/2 gHTN P: start pitocin for cervical ripening  Perry MountACOSTA,Denys Labree ROCIO, MD 9:33 PM

## 2014-09-20 NOTE — MAU Note (Addendum)
Pt states had sex last pm, unsure if sperm is what was leaking from her vagina. No discharge today. And pain began after sex.

## 2014-09-20 NOTE — Discharge Instructions (Signed)

## 2014-09-21 ENCOUNTER — Inpatient Hospital Stay (HOSPITAL_COMMUNITY): Payer: Medicaid Other | Admitting: Anesthesiology

## 2014-09-21 ENCOUNTER — Encounter (HOSPITAL_COMMUNITY): Payer: Self-pay | Admitting: *Deleted

## 2014-09-21 DIAGNOSIS — Z3A37 37 weeks gestation of pregnancy: Secondary | ICD-10-CM

## 2014-09-21 DIAGNOSIS — O133 Gestational [pregnancy-induced] hypertension without significant proteinuria, third trimester: Secondary | ICD-10-CM

## 2014-09-21 DIAGNOSIS — O99824 Streptococcus B carrier state complicating childbirth: Secondary | ICD-10-CM

## 2014-09-21 LAB — CBC
HCT: 35.3 % — ABNORMAL LOW (ref 36.0–46.0)
HCT: 33.6 % — ABNORMAL LOW (ref 36.0–46.0)
Hemoglobin: 11.9 g/dL — ABNORMAL LOW (ref 12.0–15.0)
Hemoglobin: 11.7 g/dL — ABNORMAL LOW (ref 12.0–15.0)
MCH: 29.8 pg (ref 26.0–34.0)
MCH: 30.5 pg (ref 26.0–34.0)
MCHC: 33.7 g/dL (ref 30.0–36.0)
MCHC: 34.8 g/dL (ref 30.0–36.0)
MCV: 88.3 fL (ref 78.0–100.0)
MCV: 87.5 fL (ref 78.0–100.0)
PLATELETS: 225 10*3/uL (ref 150–400)
Platelets: 229 10*3/uL (ref 150–400)
RBC: 4 MIL/uL (ref 3.87–5.11)
RBC: 3.84 MIL/uL — AB (ref 3.87–5.11)
RDW: 12.9 % (ref 11.5–15.5)
RDW: 12.8 % (ref 11.5–15.5)
WBC: 15.8 10*3/uL — AB (ref 4.0–10.5)
WBC: 21.6 10*3/uL — ABNORMAL HIGH (ref 4.0–10.5)

## 2014-09-21 LAB — TYPE AND SCREEN
ABO/RH(D): B POS
ANTIBODY SCREEN: NEGATIVE
Weak D: POSITIVE

## 2014-09-21 LAB — ABO/RH: ABO/RH(D): B POS

## 2014-09-21 LAB — RPR: RPR Ser Ql: NONREACTIVE

## 2014-09-21 MED ORDER — OXYCODONE-ACETAMINOPHEN 5-325 MG PO TABS: 2.0000 | ORAL_TABLET | ORAL | Status: DC | PRN

## 2014-09-21 MED ORDER — LANOLIN HYDROUS EX OINT: TOPICAL_OINTMENT | CUTANEOUS | Status: DC | PRN

## 2014-09-21 MED ORDER — OXYCODONE-ACETAMINOPHEN 5-325 MG PO TABS
1.0000 | ORAL_TABLET | ORAL | Status: DC | PRN
  Administered 2014-09-22 – 2014-09-23 (×5): 1 via ORAL
  Filled 2014-09-21 (×5): qty 1

## 2014-09-21 MED ORDER — DIPHENHYDRAMINE HCL 50 MG/ML IJ SOLN: 12.5000 mg | INTRAMUSCULAR | Status: DC | PRN

## 2014-09-21 MED ORDER — FENTANYL 2.5 MCG/ML BUPIVACAINE 1/10 % EPIDURAL INFUSION (WH - ANES)
14.0000 mL/h | INTRAMUSCULAR | Status: DC | PRN
  Filled 2014-09-21: qty 125

## 2014-09-21 MED ORDER — SIMETHICONE 80 MG PO CHEW: 80.0000 mg | CHEWABLE_TABLET | ORAL | Status: DC | PRN

## 2014-09-21 MED ORDER — ONDANSETRON HCL 4 MG/2ML IJ SOLN: 4.0000 mg | INTRAMUSCULAR | Status: DC | PRN

## 2014-09-21 MED ORDER — LIDOCAINE HCL (PF) 1 % IJ SOLN
INTRAMUSCULAR | Status: DC | PRN
  Administered 2014-09-21 (×2): 4 mL

## 2014-09-21 MED ORDER — LACTATED RINGERS IV SOLN: 500.0000 mL | Freq: Once | INTRAVENOUS | Status: DC

## 2014-09-21 MED ORDER — PRENATAL MULTIVITAMIN CH
1.0000 | ORAL_TABLET | Freq: Every day | ORAL | Status: DC
  Administered 2014-09-22 – 2014-09-23 (×2): 1 via ORAL
  Filled 2014-09-21 (×2): qty 1

## 2014-09-21 MED ORDER — PHENYLEPHRINE 40 MCG/ML (10ML) SYRINGE FOR IV PUSH (FOR BLOOD PRESSURE SUPPORT)
80.0000 ug | PREFILLED_SYRINGE | INTRAVENOUS | Status: DC | PRN
  Filled 2014-09-21: qty 2
  Filled 2014-09-21: qty 20

## 2014-09-21 MED ORDER — IBUPROFEN 600 MG PO TABS
600.0000 mg | ORAL_TABLET | Freq: Four times a day (QID) | ORAL | Status: DC
  Administered 2014-09-21 – 2014-09-23 (×7): 600 mg via ORAL
  Filled 2014-09-21 (×7): qty 1

## 2014-09-21 MED ORDER — BENZOCAINE-MENTHOL 20-0.5 % EX AERO: 1.0000 "application " | INHALATION_SPRAY | CUTANEOUS | Status: DC | PRN

## 2014-09-21 MED ORDER — TETANUS-DIPHTH-ACELL PERTUSSIS 5-2.5-18.5 LF-MCG/0.5 IM SUSP
0.5000 mL | Freq: Once | INTRAMUSCULAR | Status: AC
  Administered 2014-09-23: 0.5 mL via INTRAMUSCULAR
  Filled 2014-09-21: qty 0.5

## 2014-09-21 MED ORDER — PENICILLIN G POTASSIUM 5000000 UNITS IJ SOLR
5.0000 10*6.[IU] | Freq: Once | INTRAVENOUS | Status: AC
  Administered 2014-09-21: 5 10*6.[IU] via INTRAVENOUS
  Filled 2014-09-21: qty 5

## 2014-09-21 MED ORDER — PENICILLIN G POTASSIUM 5000000 UNITS IJ SOLR
2.5000 10*6.[IU] | INTRAVENOUS | Status: DC
  Administered 2014-09-21 (×2): 2.5 10*6.[IU] via INTRAVENOUS
  Filled 2014-09-21 (×9): qty 2.5

## 2014-09-21 MED ORDER — PHENYLEPHRINE 40 MCG/ML (10ML) SYRINGE FOR IV PUSH (FOR BLOOD PRESSURE SUPPORT)
80.0000 ug | PREFILLED_SYRINGE | INTRAVENOUS | Status: DC | PRN
  Filled 2014-09-21: qty 2

## 2014-09-21 MED ORDER — FENTANYL 2.5 MCG/ML BUPIVACAINE 1/10 % EPIDURAL INFUSION (WH - ANES)
INTRAMUSCULAR | Status: DC | PRN
  Administered 2014-09-21: 14 mL/h via EPIDURAL

## 2014-09-21 MED ORDER — EPHEDRINE 5 MG/ML INJ
10.0000 mg | INTRAVENOUS | Status: DC | PRN
  Filled 2014-09-21: qty 2

## 2014-09-21 MED ORDER — SENNOSIDES-DOCUSATE SODIUM 8.6-50 MG PO TABS
2.0000 | ORAL_TABLET | ORAL | Status: DC
  Administered 2014-09-21 – 2014-09-23 (×2): 2 via ORAL
  Filled 2014-09-21 (×2): qty 2

## 2014-09-21 MED ORDER — DIPHENHYDRAMINE HCL 25 MG PO CAPS: 25.0000 mg | ORAL_CAPSULE | Freq: Four times a day (QID) | ORAL | Status: DC | PRN

## 2014-09-21 MED ORDER — DIBUCAINE 1 % RE OINT: 1.0000 "application " | TOPICAL_OINTMENT | RECTAL | Status: DC | PRN

## 2014-09-21 MED ORDER — WITCH HAZEL-GLYCERIN EX PADS: 1.0000 "application " | MEDICATED_PAD | CUTANEOUS | Status: DC | PRN

## 2014-09-21 MED ORDER — ONDANSETRON HCL 4 MG PO TABS: 4.0000 mg | ORAL_TABLET | ORAL | Status: DC | PRN

## 2014-09-21 MED ORDER — ZOLPIDEM TARTRATE 5 MG PO TABS: 5.0000 mg | ORAL_TABLET | Freq: Every evening | ORAL | Status: DC | PRN

## 2014-09-21 NOTE — Progress Notes (Signed)
Jonette Matelexis S Sox is a 24 y.o. G3P2001 at 1050w2d by ultrasound admitted for induction of labor due to Hypertension.  Subjective:   Objective: BP 148/84 mmHg  Pulse 92  Temp(Src) 98.8 F (37.1 C) (Oral)  Resp 18  Ht 5' 4.5" (1.638 m)  Wt 250 lb (113.399 kg)  BMI 42.27 kg/m2  LMP 11/21/2013      FHT:  FHR: 135 bpm, variability: moderate,  accelerations:  Present,  decelerations:  Absent UC:   regular, every 3-4 minutes SVE:   Dilation: 5.5 Effacement (%): 80 Station: -1 Exam by:: [employer] StoresLawson  Labs: Lab Results  Component Value Date   WBC 15.8* 09/21/2014   HGB 11.9* 09/21/2014   HCT 35.3* 09/21/2014   MCV 88.3 09/21/2014   PLT 225 09/21/2014    Assessment / Plan: Induction of labor due to gestational hypertension,  progressing well on pitocin  Labor: Progressing normally Preeclampsia:  no signs or symptoms of toxicity Fetal Wellbeing:  Category I Pain Control:  Fentanyl I/D:  n/a Anticipated MOD:  NSVD  LAWSON, MARIE [name] 09/21/2014, 1:13 PM

## 2014-09-21 NOTE — Progress Notes (Signed)
Kimberly [name] is a 24 y.o. G3P2001 at 6452w2d by ultrasound admitted for induction of labor due to Hypertension.  Subjective:   Objective: BP 145/87 mmHg  Pulse 86  Temp(Src) 98.8 F (37.1 C) (Oral)  Resp 18  Ht 5' 4.5" (1.638 m)  Wt 250 lb (113.399 kg)  BMI 42.27 kg/m2  LMP 11/21/2013      FHT:  FHR: 150 bpm, variability: moderate,  accelerations:  Present,  decelerations:  Absent UC:   regular, every 2 minutes SVE:   Dilation: 4.5 Effacement (%): 80 Station: -2 Exam by:: [employer]son  Labs: Lab Results  Component Value Date   WBC 15.8* 09/21/2014   HGB 11.9* 09/21/2014   HCT 35.3* 09/21/2014   MCV 88.3 09/21/2014   PLT 225 09/21/2014    Assessment / Plan: Induction of labor due to gestational hypertension,  progressing well on pitocin  Labor: SVE 4/70/-1 AROM cl fluid noted. IUPC placed without difficulty Preeclampsia:  no signs or symptoms of toxicity Fetal Wellbeing:  Category I Pain Control:  Labor support without medications I/D:  n/a Anticipated MOD:  NSVD  Kimberly [name] Kimberly [name] 09/21/2014, 9:36 AM

## 2014-09-21 NOTE — Anesthesia Preprocedure Evaluation (Signed)
Anesthesia Evaluation  Patient identified by MRN, date of birth, ID band Patient awake    Reviewed: Allergy & Precautions, NPO status , Patient's Chart, lab work & pertinent test results  History of Anesthesia Complications Negative for: history of anesthetic complications  Airway Mallampati: III  TM Distance: >3 FB Neck ROM: Full    Dental no notable dental hx. (+) Dental Advisory Given   Pulmonary neg pulmonary ROS,  breath sounds clear to auscultation  Pulmonary exam normal       Cardiovascular hypertension, Rhythm:Regular Rate:Normal     Neuro/Psych PSYCHIATRIC DISORDERS Anxiety Depression negative neurological ROS     GI/Hepatic negative GI ROS, Neg liver ROS,   Endo/Other  Morbid obesity  Renal/GU negative Renal ROS  negative genitourinary   Musculoskeletal negative musculoskeletal ROS (+)   Abdominal   Peds negative pediatric ROS (+)  Hematology negative hematology ROS (+)   Anesthesia Other Findings   Reproductive/Obstetrics negative OB ROS                             Anesthesia Physical Anesthesia Plan  ASA: III  Anesthesia Plan: Epidural   Post-op Pain Management:    Induction:   Airway Management Planned:   Additional Equipment:   Intra-op Plan:   Post-operative Plan:   Informed Consent: I have reviewed the patients History and Physical, chart, labs and discussed the procedure including the risks, benefits and alternatives for the proposed anesthesia with the patient or authorized representative who has indicated his/her understanding and acceptance.   Dental advisory given  Plan Discussed with:   Anesthesia Plan Comments:         Anesthesia Quick Evaluation

## 2014-09-21 NOTE — Anesthesia Procedure Notes (Signed)
Epidural Patient location during procedure: OB Start time: 09/21/2014 1:25 PM  Staffing Anesthesiologist: Karie SchwalbeJUDD, Daritza Brees JENNETTE Performed by: anesthesiologist   Preanesthetic Checklist Completed: patient identified, site marked, surgical consent, pre-op evaluation, timeout performed, IV checked, risks and benefits discussed and monitors and equipment checked  Epidural Patient position: sitting Prep: site prepped and draped and DuraPrep Patient monitoring: continuous pulse ox and blood pressure Approach: midline Location: L3-L4 Injection technique: LOR saline  Needle:  Needle type: Tuohy  Needle gauge: 17 G Needle length: 9 cm and 9 Needle insertion depth: 8 cm Catheter type: closed end flexible Catheter size: 19 Gauge Catheter at skin depth: 14 cm Test dose: negative  Assessment Events: blood not aspirated, injection not painful, no injection resistance, negative IV test and no paresthesia  Additional Notes Patient identified. Risks/Benefits/Options discussed with patient including but not limited to bleeding, infection, nerve damage, paralysis, failed block, incomplete pain control, headache, blood pressure changes, nausea, vomiting, reactions to medication both or allergic, itching and postpartum back pain. Confirmed with bedside nurse the patient's most recent platelet count. Confirmed with patient that they are not currently taking any anticoagulation, have any bleeding history or any family history of bleeding disorders. Patient expressed understanding and wished to proceed. All questions were answered. Sterile technique was used throughout the entire procedure. Please see nursing notes for vital signs. Test dose was given through epidural catheter and negative prior to continuing to dose epidural or start infusion. Warning signs of high block given to the patient including shortness of breath, tingling/numbness in hands, complete motor block, or any concerning symptoms with  instructions to call for help. Patient was given instructions on fall risk and not to get out of bed. All questions and concerns addressed with instructions to call with any issues or inadequate analgesia.

## 2014-09-22 LAB — CBC
HCT: 30.4 % — ABNORMAL LOW (ref 36.0–46.0)
HEMOGLOBIN: 10.5 g/dL — AB (ref 12.0–15.0)
MCH: 30.4 pg (ref 26.0–34.0)
MCHC: 34.5 g/dL (ref 30.0–36.0)
MCV: 88.1 fL (ref 78.0–100.0)
Platelets: 214 10*3/uL (ref 150–400)
RBC: 3.45 MIL/uL — ABNORMAL LOW (ref 3.87–5.11)
RDW: 12.9 % (ref 11.5–15.5)
WBC: 15.4 10*3/uL — ABNORMAL HIGH (ref 4.0–10.5)

## 2014-09-22 NOTE — Progress Notes (Signed)
Clinical Social Work Department PSYCHOSOCIAL ASSESSMENT - MATERNAL/CHILD 09/22/2014  Patient:  Kimberly [name],Kimberly [name]  Account Number:  [id number]  Admit Date:  09/20/2014  [name]   Clinical Social Worker:  Kimberly [name], CLINICAL SOCIAL WORKER   Date/Time:  09/22/2014 10:30 AM  Date Referred:  09/21/2014   Referral source  Central Nursery     Referred reason  [name]   Other referral source:    I:  FAMILY / HOME ENVIRONMENT Child'[name] legal guardian:  PARENT  Guardian - [name] Guardian - Age Guardian - Address  [name] [address] [place], [place] [address]   Other household support members/support persons [name] Relationship DOB  Kimberly [name] SON 22 years old   Other support:   [name] endorsed strong family support.    II  PSYCHOSOCIAL DATA Information Source:  Patient Interview  Event organiserinancial and Community Resources Employment:   [name] reported that she is current unemployed.   Financial resources:  Medicaid If Medicaid - County:  Kimberly [name] Other  Sales executiveood Stamps  WIC   School / Grade:  N/A Government social research officerMaternity Care Coordinator / Child Services Coordination / Early Interventions:   None reported  Cultural issues impacting care:   None reported    III  STRENGTHS Strengths  Adequate Resources  Home prepared for Child (including basic supplies)  Supportive family/friends   Strength comment:    IV  RISK FACTORS AND CURRENT PROBLEMS Current Problem:  YES   Risk Factor & Current Problem Patient Issue Family Issue Risk Factor / Current Problem Comment  Late and limited prenatal care Y N [name] did not initiate care until 35 weeks. See assessment below that explores reasons why.  Infant'[name] UDS and MDS are pending.    V  SOCIAL WORK ASSESSMENT CSW received consult due to [name].  Prior to meeting with [name], CSW completed chart review.  [name] did not initiate prenatal care until 35 weeks and 3 days.  [name] with a history of a neonatal demise at 3338 weeks, with subsequent depression and anxiety.     [name] presented in a pleasant mood and displayed an appropriate range in affect upon CSW arrival.  She presented as guarded and anxious as CSW introduced self and role at the [name].  During the visit, she was observed to be attending to and bonding with the infant.   CSW assisted the [name] to identify and process her thoughts and feelings as she transitions into the postpartum period.  She shared that she has a positive [name], and discussed satisfaction with her delivery experience and her transition into the postpartum.  She stated that she has been able to provide skin to skin, and is grateful that the infant is "alive".  Per [name], she had a difficult time feeling emotionally attached and invested in the pregnancy.  Upon further exploration, the [name] also identified as "feeling numb" to emotions during the pregnancy.  She shared that she was fearful of becoming emotionally attached to the infant out of fear of having a subsequent neonatal loss.  For this reason, she also discussed being cautious related to preparation for his arrival.  She discussed that she does have basic infant supplies, including clothes, diapers, a car seat, and a place for the infant to sleep; however, she did not want to "over buy" in the event that the infant died.  She reported that despite the limited attachment and limited excitement during the pregnancy, she is now becoming excited for his arrival.  She recognized that she continues to be anxious out of fear that something bad will negatively happen to him.    The [name] reported that she prefers to not discuss the neonatal loss since she believes that she has "dealt with it".  CSW respected the [name]'[name] desires, but also expressed concern that the loss continues to impact her since it impacted her preparations for the infant'[name] arrival.  [name] also acknowledged how her previous loss caused her to not get prenatal care.  She reflected upon attempting to do  "everything right" with the previous pregnancy but with limited success.  CSW attempted to explore potential feelings of guilt, but [name] continued to present as minimally receptive to processing this loss.  The [name] shared that it has been difficult for her to be back at [name] since all providers know her story, and she believes she has been asked to re-live her thoughts and feelings with the loss each time someone asks about her pregnancy history.  She stated that she is attempting to move forward and focus on this healthy infant.   [name] acknowledged [name] drug screen policy due to [name].  She denied any questions or concerns, and denied any drug use during the pregnancy.  The [name] acknowledges need to closely monitor her mood in subsequent risks due to history of depression and anxiety which heightens her risk for developing postpartum depression. [name] endorsed strong family support who will be able to assist her as she returns home.    VI SOCIAL WORK PLAN Social Work Secretary/administrator  No Further Intervention Required / No Barriers to Discharge   Type of pt/family education:   Postpartum depression  [name] drug screen policy   If child protective services report - county:  N/A If child protective services report - date:  N/A Information/referral to community resources comment:   No needs identified at this time   Other social work plan:   CSW to follow up as needed or upon [name] request.   CSW will monitor UDS and MDS and will make CPS report if positive for substances.    [name] reported that she prefers to not discuss previous neonatal loss at this time.  CSW recommends limiting discussion about the loss given the [name]'[name] preferences.  [name] denied professional assistance to assist with the grief and loss, but she also presented as anxious and guarded and did not present as ready to discuss the loss.  She appears to be bonding and interacting with this infant despite her  limited attachment during the pregnancy, but may require subsequent support in the postpartum period as she continues to process her thoughts, feelings, and potential feelings of guilt about the neonatal loss.

## 2014-09-22 NOTE — Progress Notes (Signed)
Post Partum Day 1 Subjective: no complaints, up ad lib, voiding, tolerating PO and + flatus  Objective: Blood pressure 114/59, pulse 91, temperature 97.9 F (36.6 C), temperature source Oral, resp. rate 18, height 5' 4.5" (1.638 m), weight 250 lb (113.399 kg), last menstrual period 11/21/2013, SpO2 99 %, unknown if currently breastfeeding.  Physical Exam:  General: alert, cooperative, appears stated age and no distress Lochia: appropriate Uterine Fundus: firm Incision: n/a DVT Evaluation: No evidence of DVT seen on physical exam. Negative Homan's sign. No cords or calf tenderness. No significant calf/ankle edema.   Recent Labs  09/21/14 1740 09/22/14 0600  HGB 11.7* 10.5*  HCT 33.6* 30.4*    Assessment/Plan: Plan for discharge tomorrow   LOS: 2 days   Wyvonnia DuskyLAWSON, Macallister Ashmead [name] 09/22/2014, 6:39 AM

## 2014-09-22 NOTE — Anesthesia Postprocedure Evaluation (Signed)
  Anesthesia Post-op Note  Patient: Kimberly [name]  Procedure(s) Performed: * No procedures listed *  Patient Location: Mother/Baby  Anesthesia Type:Epidural  Level of Consciousness: awake, alert , oriented and patient cooperative  Airway and Oxygen Therapy: Patient Spontanous Breathing  Post-op Pain: none  Post-op Assessment: Post-op Vital signs reviewed, Patient's Cardiovascular Status Stable, Respiratory Function Stable, Patent Airway, No headache, No backache, No residual numbness and No residual motor weakness  Post-op Vital Signs: Reviewed and stable  Last Vitals:  Filed Vitals:   09/22/14 0636  BP: 114/59  Pulse: 91  Temp: 36.6 C  Resp: 18    Complications: No apparent anesthesia complications

## 2014-09-23 MED ORDER — MEASLES, MUMPS & RUBELLA VAC ~~LOC~~ INJ
0.5000 mL | INJECTION | Freq: Once | SUBCUTANEOUS | Status: AC
  Administered 2014-09-23: 0.5 mL via SUBCUTANEOUS
  Filled 2014-09-23: qty 0.5

## 2014-09-23 MED ORDER — IBUPROFEN 600 MG PO TABS
600.0000 mg | ORAL_TABLET | Freq: Four times a day (QID) | ORAL | Status: DC
Start: 2014-09-23 — End: 2016-02-26

## 2014-09-23 NOTE — Progress Notes (Signed)
UR chart review completed.  

## 2014-09-23 NOTE — Discharge Summary (Signed)
Obstetric Discharge Summary Reason for Admission: induction of labor secondary to gestational HTN Prenatal Procedures: none Intrapartum Procedures: spontaneous vaginal delivery Postpartum Procedures: none Complications-Operative and Postpartum: none  Delivery Note At 4:20 PM a healthy female was delivered via VBAC, Spontaneous (Presentation: Left Occiput Anterior).  APGAR: 8, 9; weight 5 lb 8.7 oz (2515 g).   Placenta status: Intact, Spontaneous.  Cord: 3 vessels with the following complications: None.    Anesthesia: Epidural  Episiotomy: None Lacerations: None Suture Repair: N/A Est. Blood Loss (mL): 300  Mom to postpartum.  Baby to Couplet care / Skin to Skin.  Hospital Course:  Active Problems:   Hypertension affecting pregnancy in third trimester   Prior pregnancy with fetal demise   Third trimester pregnancy   [redacted] weeks gestation of pregnancy   Kimberly [name] is a 24 y.o. [id number] s/p SVD.  Patient presented was admitted to L&D.  She has postpartum course that was uncomplicated including no problems with ambulating, PO intake, urination, pain, or bleeding. The pt feels ready to go home and  will be discharged with outpatient follow-up.   Today: No acute events overnight.  Pt denies problems with ambulating, voiding or po intake.  She denies nausea or vomiting.  Pain is well controlled.  She has had flatus. She has not had bowel movement.  Lochia Minimal.  Plan for birth control is  oral contraceptives (estrogen/progesterone).  Method of Feeding: Bottle   H/H: Lab Results  Component Value Date/Time   HGB 10.5* 09/22/2014 06:00 AM   HCT 30.4* 09/22/2014 06:00 AM    Discharge Diagnoses: Term Pregnancy-delivered  Discharge Information: Date: 09/23/2014 Activity: pelvic rest Diet: routine  Medications: Ibuprofen Breast feeding:  No: Bottle Condition: stable Instructions: refer to handout Discharge to: home     Medication List    STOP taking these medications      prenatal multivitamin Tabs tablet      TAKE these medications        ibuprofen 600 MG tablet  Commonly known as:  ADVIL,MOTRIN  Take 1 tablet (600 mg total) by mouth every 6 (six) hours.     OB COMPLETE PETITE 35-5-1-200 MG Caps  Take 1 capsule by mouth daily before breakfast.     zolpidem 5 MG tablet  Commonly known as:  AMBIEN  Take 1 tablet (5 mg total) by mouth at bedtime as needed for sleep.           Follow-up Information    [facility].   Specialty:  Obstetrics and Gynecology   Why:  PLEASE CALL FOR AN APPOINTMENT, YOU NEED CLOSE MONITORING   Contact information:   [address] [place] [id number] [phone]      Araceli Boucheumley, [place] N, DO Family Medicine, PGY-1 09/23/2014,8:52 AM

## 2014-10-30 ENCOUNTER — Ambulatory Visit: Payer: Medicaid Other | Admitting: Obstetrics & Gynecology

## 2014-10-30 ENCOUNTER — Telehealth: Payer: Self-pay | Admitting: *Deleted

## 2014-10-30 NOTE — Telephone Encounter (Signed)
Contacted patient, informed of missed appointment, pt to call front office to reschedule.

## 2015-04-16 ENCOUNTER — Emergency Department (HOSPITAL_COMMUNITY)
Admission: EM | Admit: 2015-04-16 | Discharge: 2015-04-16 | Disposition: A | Discharge status: Home / Self Care | Payer: Medicaid Other | Attending: Emergency Medicine | Admitting: Emergency Medicine

## 2015-04-16 ENCOUNTER — Encounter (HOSPITAL_COMMUNITY): Payer: Self-pay

## 2015-04-16 DIAGNOSIS — N939 Abnormal uterine and vaginal bleeding, unspecified: Secondary | ICD-10-CM | POA: Insufficient documentation

## 2015-04-16 DIAGNOSIS — Z8659 Personal history of other mental and behavioral disorders: Secondary | ICD-10-CM | POA: Insufficient documentation

## 2015-04-16 DIAGNOSIS — D649 Anemia, unspecified: Secondary | ICD-10-CM | POA: Diagnosis not present

## 2015-04-16 DIAGNOSIS — I10 Essential (primary) hypertension: Secondary | ICD-10-CM | POA: Insufficient documentation

## 2015-04-16 DIAGNOSIS — Z8619 Personal history of other infectious and parasitic diseases: Secondary | ICD-10-CM | POA: Insufficient documentation

## 2015-04-16 DIAGNOSIS — Z8719 Personal history of other diseases of the digestive system: Secondary | ICD-10-CM | POA: Insufficient documentation

## 2015-04-16 DIAGNOSIS — Z3202 Encounter for pregnancy test, result negative: Secondary | ICD-10-CM | POA: Insufficient documentation

## 2015-04-16 LAB — COMPREHENSIVE METABOLIC PANEL
ALBUMIN: 3.3 g/dL — AB (ref 3.5–5.0)
ALT: 14 U/L (ref 14–54)
ANION GAP: 8 (ref 5–15)
AST: 19 U/L (ref 15–41)
Alkaline Phosphatase: 61 U/L (ref 38–126)
BUN: 6 mg/dL (ref 6–20)
CHLORIDE: 107 mmol/L (ref 101–111)
CO2: 23 mmol/L (ref 22–32)
Calcium: 9.2 mg/dL (ref 8.9–10.3)
Creatinine, Ser: 0.74 mg/dL (ref 0.44–1.00)
GFR calc Af Amer: >60 mL/min (ref >60)
GFR calc non Af Amer: >60 mL/min (ref >60)
GLUCOSE: 89 mg/dL (ref 65–99)
POTASSIUM: 3.7 mmol/L (ref 3.5–5.1)
SODIUM: 138 mmol/L (ref 135–145)
TOTAL PROTEIN: 6.7 g/dL (ref 6.5–8.1)
Total Bilirubin: 0.7 mg/dL (ref 0.3–1.2)

## 2015-04-16 LAB — CBC WITH DIFFERENTIAL/PLATELET
BASOS ABS: 0 10*3/uL (ref 0.0–0.1)
BASOS PCT: 0 %
EOS ABS: 0.1 10*3/uL (ref 0.0–0.7)
Eosinophils Relative: 1 %
HCT: 34.2 % — ABNORMAL LOW (ref 36.0–46.0)
Hemoglobin: 11.5 g/dL — ABNORMAL LOW (ref 12.0–15.0)
Lymphocytes Relative: 26 %
Lymphs Abs: 2.2 10*3/uL (ref 0.7–4.0)
MCH: 29.1 pg (ref 26.0–34.0)
MCHC: 33.6 g/dL (ref 30.0–36.0)
MCV: 86.6 fL (ref 78.0–100.0)
MONO ABS: 0.6 10*3/uL (ref 0.1–1.0)
MONOS PCT: 7 %
NEUTROS ABS: 5.5 10*3/uL (ref 1.7–7.7)
Neutrophils Relative %: 66 %
PLATELETS: 252 10*3/uL (ref 150–400)
RBC: 3.95 MIL/uL (ref 3.87–5.11)
RDW: 12 % (ref 11.5–15.5)
WBC: 8.4 10*3/uL (ref 4.0–10.5)

## 2015-04-16 LAB — GC/CHLAMYDIA PROBE AMP (~~LOC~~) NOT AT ARMC
CHLAMYDIA, DNA PROBE: NEGATIVE
NEISSERIA GONORRHEA: NEGATIVE

## 2015-04-16 LAB — WET PREP, GENITAL
TRICH WET PREP: NONE SEEN
Yeast Wet Prep HPF POC: NONE SEEN

## 2015-04-16 LAB — I-STAT BETA HCG BLOOD, ED (MC, WL, AP ONLY): I-stat hCG, quantitative: <5 m[IU]/mL (ref <5)

## 2015-04-16 LAB — HIV ANTIBODY (ROUTINE TESTING W REFLEX): HIV Screen 4th Generation wRfx: NONREACTIVE

## 2015-04-16 LAB — HCG, QUANTITATIVE, PREGNANCY: hCG, Beta Chain, Quant, S: 4 m[IU]/mL (ref <5)

## 2015-04-16 LAB — ABO/RH: ABO/RH(D): B POS

## 2015-04-16 NOTE — ED Notes (Signed)
Pt reports LMP on 02/26/15.  Pt took home pregnancy test which came back positive.  No prenatal care yet.  Pt reports she began cramping with bleeding yesterday morning.  Pt reports small amounts of clots and uses aprox. 2 pads per hr.  Pt reports the cramping has resolved but there is still some slight bleeding.

## 2015-04-16 NOTE — ED Provider Notes (Signed)
CSN: [id number]     Arrival date & time 04/16/15  0755 History   First MD Initiated Contact with Patient 04/16/15 0800     Chief Complaint  Patient presents with  . Vaginal Bleeding     (Consider location/radiation/quality/duration/timing/severity/associated sxs/prior Treatment) The history is provided by the patient and medical records. No language interpreter was used.     Kimberly [name] is a 24 y.o. female  [phone] with h/o fetal demise >28wks and VBAC without complication on 09/23/14 presents to the Emergency Department complaining of gradual, persistent, progressively worsening vaginal bleeding with clots onset yesterday and abdominal cramping which has resolved.  Pt reports positive home pregnancy test this morning. Pt reports she is sexually active (last time 3 weeks) with 1 female partner and no birth control.  Pt reports this was not a planned pregnancy.  LMP: 02/26/15  No aggravating or alleviating factors.  Pt denies fever, chills, headache, neck pain, chest pain, nausea, vomiting, weakness, dizziness, syncope, dysuria.      Past Medical History  Diagnosis Date  . Gonorrhea   . Chlamydia   . Constipation   . Hypertension   . Pregnancy induced hypertension   . Anxiety     because of last preg,scared something is wrong  . Depression     after loss of preg/child   Past Surgical History  Procedure Laterality Date  . Cesarean section N/A 03/20/2013    Procedure: CESAREAN SECTION;  Surgeon: Kathreen Cosier, MD;  Location: [facility];  Service: Obstetrics;  Laterality: N/A;   Family History  Problem Relation Age of Onset  . Cancer Neg Hx   . Diabetes Neg Hx   . Heart disease Neg Hx   . Hypertension Neg Hx   . Stroke Neg Hx   . Hearing loss Neg Hx   . Asthma Son    Social History  Substance Use Topics  . Smoking status: Never Smoker   . Smokeless tobacco: Never Used  . Alcohol Use: No   OB History    Gravida Para Term Preterm AB TAB SAB Ectopic Multiple Living   0  0 0 0 0 0 2     Review of Systems  Constitutional: Negative for fever, diaphoresis, appetite change, fatigue and unexpected weight change.  HENT: Negative for mouth sores.   Eyes: Negative for visual disturbance.  Respiratory: Negative for cough, chest tightness, shortness of breath and wheezing.   Cardiovascular: Negative for chest pain.  Gastrointestinal: Positive for abdominal pain (lower abd cramping now resolved). Negative for nausea, vomiting, diarrhea and constipation.  Endocrine: Negative for polydipsia, polyphagia and polyuria.  Genitourinary: Positive for vaginal bleeding. Negative for dysuria, urgency, frequency and hematuria.  Musculoskeletal: Negative for back pain and neck stiffness.  Skin: Negative for rash.  Allergic/Immunologic: Negative for immunocompromised state.  Neurological: Negative for syncope, light-headedness and headaches.  Hematological: Does not bruise/bleed easily.  Psychiatric/Behavioral: Negative for sleep disturbance. The patient is not nervous/anxious.       Allergies  Review of patient's allergies indicates no known allergies.  Home Medications   Prior to Admission medications   Medication Sig Start Date End Date Taking? Authorizing Provider  ibuprofen (ADVIL,MOTRIN) 600 MG tablet Take 1 tablet (600 mg total) by mouth every 6 (six) hours. Patient not taking: Reported on 04/16/2015 09/23/14   Lehigh N Rumley, DO   BP 126/64 mmHg  Pulse 73  Temp(Src) 98.5 F (36.9 C) (Oral)  Resp 20  Ht  (1.6  m)  Wt 230 lb (104.327 kg)  BMI 40.75 kg/m2  SpO2 99%  LMP 04/15/2014  Breastfeeding? Unknown Physical Exam  Constitutional: She appears well-developed and well-nourished. No distress.  Awake, alert, nontoxic appearance  HENT:  Head: Normocephalic and atraumatic.  Mouth/Throat: Oropharynx is clear and moist. No oropharyngeal exudate.  Eyes: Conjunctivae are normal. No scleral icterus.  Neck: Normal range of motion. Neck supple.   Cardiovascular: Normal rate, regular rhythm, normal heart sounds and intact distal pulses.   No murmur heard. Pulmonary/Chest: Effort normal and breath sounds normal. No respiratory distress. She has no wheezes.  Equal chest expansion  Abdominal: Soft. Bowel sounds are normal. She exhibits no mass. There is no tenderness. There is no rebound and no guarding. Hernia confirmed negative in the right inguinal area and confirmed negative in the left inguinal area.  Genitourinary: Uterus normal. No labial fusion. There is no rash, tenderness or lesion on the right labia. There is no rash, tenderness or lesion on the left labia. Uterus is not deviated, not enlarged, not fixed and not tender. Cervix exhibits no motion tenderness, no discharge and no friability. Right adnexum displays no mass, no tenderness and no fullness. Left adnexum displays no mass, no tenderness and no fullness. There is bleeding in the vagina. No erythema or tenderness in the vagina. No foreign body around the vagina. No signs of injury around the vagina. No vaginal discharge found.  Blood in the vaginal vault with several small clots Cervical Os closed  Musculoskeletal: Normal range of motion. She exhibits no edema.  Lymphadenopathy:       Right: No inguinal adenopathy present.       Left: No inguinal adenopathy present.  Neurological: She is alert.  Speech is clear and goal oriented Moves extremities without ataxia  Skin: Skin is warm and dry. She is not diaphoretic. No erythema.  Psychiatric: She has a normal mood and affect.  Nursing note and vitals reviewed.   ED Course  Procedures (including critical care time) Labs Review Labs Reviewed  WET PREP, GENITAL - Abnormal; Notable for the following:    Clue Cells Wet Prep HPF POC TOO NUMEROUS TO COUNT (*)    WBC, Wet Prep HPF POC FEW (*)    All other components within normal limits  CBC WITH DIFFERENTIAL/PLATELET - Abnormal; Notable for the following:    Hemoglobin 11.5  (*)    HCT 34.2 (*)    All other components within normal limits  COMPREHENSIVE METABOLIC PANEL - Abnormal; Notable for the following:    Albumin 3.3 (*)    All other components within normal limits  HCG, QUANTITATIVE, PREGNANCY  URINALYSIS, ROUTINE W REFLEX MICROSCOPIC (NOT AT [facility])  HIV ANTIBODY (ROUTINE TESTING)  I-STAT BETA HCG BLOOD, ED ([facility], [facility], [facility] ONLY)  ABO/RH  GC/CHLAMYDIA PROBE AMP ([place]) NOT AT [name]    MDM   Final diagnoses:  Vaginal bleeding   Kimberly [name] presents with positive home pregnancy test and vaginal bleeding.  Pelvic exam with clots and closed cervical os.  9:24 AM  Labs reassuring.  HCG 4. Mild anemia with hx of same.  No abd pain at this time and repeat exam with persistence of benign abd.  No [place] needed at this time; doubt ectopic.  Likely a complete AB with neg pregnancy test here, LMP 1 mo ago and vaginal bleeding with reported large clots.  Pt will need 48 hour f/u with women's outpatient clinic or return to the ED for repeat  lab work.  BP 126/64 mmHg  Pulse 73  Temp(Src) 98.5 F (36.9 C) (Oral)  Resp 20  Ht  (1.6 m)  Wt 230 lb (104.327 kg)  BMI 40.75 kg/m2  SpO2 99%  LMP 04/15/2014  Breastfeeding? Mariann Barter, PA-C 04/16/15 1610  Elwin Mocha, MD 04/16/15 [phone]

## 2015-04-16 NOTE — Discharge Instructions (Signed)
1. Medications: usual home medications 2. Treatment: rest, drink plenty of fluids,  3. Follow Up: Please followup at the [facility] outpatient clinic in 48 hours for repeat exam; Please return to the ER for worsening symptoms, increasing pain, fever or other concerns   Abnormal Uterine Bleeding Abnormal uterine bleeding can affect women at various stages in life, including teenagers, women in their reproductive years, pregnant women, and women who have reached menopause. Several kinds of uterine bleeding are considered abnormal, including:  Bleeding or spotting between periods.   Bleeding after sexual intercourse.   Bleeding that is heavier or more than normal.   Periods that last longer than usual.  Bleeding after menopause.  Many cases of abnormal uterine bleeding are minor and simple to treat, while others are more serious. Any type of abnormal bleeding should be evaluated by your health care provider. Treatment will depend on the cause of the bleeding. HOME CARE INSTRUCTIONS Monitor your condition for any changes. The following actions may help to alleviate any discomfort you are experiencing:  Avoid the use of tampons and douches as directed by your health care provider.  Change your pads frequently. You should get regular pelvic exams and Pap tests. Keep all follow-up appointments for diagnostic tests as directed by your health care provider.  SEEK MEDICAL CARE IF:   Your bleeding lasts more than 1 week.   You feel dizzy at times.  SEEK IMMEDIATE MEDICAL CARE IF:   You pass out.   You are changing pads every 15 to 30 minutes.   You have abdominal pain.  You have a fever.   You become sweaty or weak.   You are passing large blood clots from the vagina.   You start to feel nauseous and vomit. MAKE SURE YOU:   Understand these instructions.  Will watch your condition.  Will get help right away if you are not doing well or get worse. Document Released:  07/04/2005 Document Revised: 07/09/2013 Document Reviewed: 01/31/2013 [facility] Patient Information 2015 [place], [place]. This information is not intended to replace advice given to you by your health care provider. Make sure you discuss any questions you have with your health care provider.

## 2015-07-19 NOTE — L&D Delivery Note (Signed)
Delivery Note At  a viable female was delivered via  (Presentation: direct OA ;  ).  APGAR: 8 and 9 , Placenta status: intact with 3 vessel cord with the following complications: no prenatal care  Anesthesia:  None, lidocaine for repair of vaginal tear Episiotomy:   none Lacerations:  second degree Suture Repair: 2.0 vicryl Est. Blood Loss (mL):  200  Mom to postpartum.  Baby to Couplet care / Skin to Skin.  Ernestina Pennaicholas Kadrian Partch 02/26/2016, 7:45 AM

## 2015-10-21 ENCOUNTER — Ambulatory Visit (INDEPENDENT_AMBULATORY_CARE_PROVIDER_SITE_OTHER): Payer: Medicaid Other | Admitting: *Deleted

## 2015-10-21 DIAGNOSIS — Z3687 Encounter for antenatal screening for uncertain dates: Secondary | ICD-10-CM

## 2015-10-21 DIAGNOSIS — Z3490 Encounter for supervision of normal pregnancy, unspecified, unspecified trimester: Secondary | ICD-10-CM

## 2015-10-21 DIAGNOSIS — Z3201 Encounter for pregnancy test, result positive: Secondary | ICD-10-CM | POA: Diagnosis not present

## 2015-10-21 MED ORDER — PRENATAL VITAMINS PLUS 27-1 MG PO TABS: 1.0000 | ORAL_TABLET | Freq: Every day | ORAL | Status: DC

## 2015-10-21 NOTE — Progress Notes (Signed)
Here for pregnancy test which was positive . LMP was in October, not sure of date which makes her at least 26 weeks. Would like to start care since history of high risk pregnancy and hx of loss.  States is not feeling baby move yet- FHR obtained with doppler=147.  Will order US today and go ahead and draw blood today.

## 2015-10-22 LAB — PRENATAL PROFILE (SOLSTAS)
ANTIBODY SCREEN: NEGATIVE
BASOS ABS: 0 {cells}/uL (ref 0–200)
Basophils Relative: 0 %
EOS PCT: 1 %
Eosinophils Absolute: 103 cells/uL (ref 15–500)
HEMATOCRIT: 33.5 % — AB (ref 35.0–45.0)
HEMOGLOBIN: 11.2 g/dL — AB (ref 11.7–15.5)
HEP B S AG: NEGATIVE
HIV 1&2 Ab, 4th Generation: NONREACTIVE
LYMPHS PCT: 18 %
Lymphs Abs: 1854 cells/uL (ref 850–3900)
MCH: 29 pg (ref 27.0–33.0)
MCHC: 33.4 g/dL (ref 32.0–36.0)
MCV: 86.8 fL (ref 80.0–100.0)
MONOS PCT: 9 %
MPV: 10.5 fL (ref 7.5–12.5)
Monocytes Absolute: 927 cells/uL (ref 200–950)
NEUTROS PCT: 72 %
Neutro Abs: 7416 cells/uL (ref 1500–7800)
Platelets: 277 10*3/uL (ref 140–400)
RBC: 3.86 MIL/uL (ref 3.80–5.10)
RDW: 13.8 % (ref 11.0–15.0)
RH TYPE: POSITIVE
RUBELLA: 1.64 {index} — AB (ref <0.90)
WBC: 10.3 10*3/uL (ref 3.8–10.8)

## 2015-10-22 LAB — POCT PREGNANCY, URINE: PREG TEST UR: POSITIVE — AB

## 2015-10-23 LAB — HEMOGLOBINOPATHY EVALUATION
HEMOGLOBIN OTHER: 0 %
HGB A2 QUANT: 3 % (ref 2.2–3.2)
HGB F QUANT: 0 % (ref 0.0–2.0)
Hgb A: 97 % (ref 96.8–97.8)
Hgb S Quant: 0 %

## 2015-10-27 ENCOUNTER — Ambulatory Visit (HOSPITAL_COMMUNITY)
Admission: RE | Admit: 2015-10-27 | Discharge: 2015-10-27 | Disposition: A | Discharge status: Home / Self Care | Payer: Medicaid Other | Source: Ambulatory Visit | Attending: Obstetrics & Gynecology | Admitting: Obstetrics & Gynecology

## 2015-10-27 DIAGNOSIS — Z3687 Encounter for antenatal screening for uncertain dates: Secondary | ICD-10-CM

## 2015-10-27 DIAGNOSIS — Z3A23 23 weeks gestation of pregnancy: Secondary | ICD-10-CM | POA: Insufficient documentation

## 2015-10-27 DIAGNOSIS — O09292 Supervision of pregnancy with other poor reproductive or obstetric history, second trimester: Secondary | ICD-10-CM | POA: Diagnosis not present

## 2015-10-27 DIAGNOSIS — O09892 Supervision of other high risk pregnancies, second trimester: Secondary | ICD-10-CM | POA: Diagnosis present

## 2015-10-27 DIAGNOSIS — O34219 Maternal care for unspecified type scar from previous cesarean delivery: Secondary | ICD-10-CM | POA: Insufficient documentation

## 2015-10-27 DIAGNOSIS — Z36 Encounter for antenatal screening of mother: Secondary | ICD-10-CM | POA: Diagnosis not present

## 2015-10-27 DIAGNOSIS — Z3490 Encounter for supervision of normal pregnancy, unspecified, unspecified trimester: Secondary | ICD-10-CM

## 2016-02-26 ENCOUNTER — Encounter (HOSPITAL_COMMUNITY): Payer: Self-pay

## 2016-02-26 ENCOUNTER — Inpatient Hospital Stay (HOSPITAL_COMMUNITY)
Admission: AD | Admit: 2016-02-26 | Discharge: 2016-02-28 | DRG: 774 | Disposition: A | Discharge status: Home / Self Care | Payer: Medicaid Other | Source: Ambulatory Visit | Attending: Obstetrics and Gynecology | Admitting: Obstetrics and Gynecology

## 2016-02-26 DIAGNOSIS — Z825 Family history of asthma and other chronic lower respiratory diseases: Secondary | ICD-10-CM

## 2016-02-26 DIAGNOSIS — O99344 Other mental disorders complicating childbirth: Secondary | ICD-10-CM | POA: Diagnosis present

## 2016-02-26 DIAGNOSIS — O1002 Pre-existing essential hypertension complicating childbirth: Secondary | ICD-10-CM

## 2016-02-26 DIAGNOSIS — Z3A40 40 weeks gestation of pregnancy: Secondary | ICD-10-CM

## 2016-02-26 DIAGNOSIS — F419 Anxiety disorder, unspecified: Secondary | ICD-10-CM | POA: Diagnosis present

## 2016-02-26 LAB — RPR: RPR Ser Ql: NONREACTIVE

## 2016-02-26 LAB — COMPREHENSIVE METABOLIC PANEL
ALBUMIN: 3.1 g/dL — AB (ref 3.5–5.0)
ALK PHOS: 159 U/L — AB (ref 38–126)
ALT: 12 U/L — AB (ref 14–54)
ANION GAP: 8 (ref 5–15)
AST: 15 U/L (ref 15–41)
BUN: 9 mg/dL (ref 6–20)
CALCIUM: 9.1 mg/dL (ref 8.9–10.3)
CO2: 22 mmol/L (ref 22–32)
Chloride: 105 mmol/L (ref 101–111)
Creatinine, Ser: 0.57 mg/dL (ref 0.44–1.00)
GFR calc Af Amer: >60 mL/min (ref >60)
GFR calc non Af Amer: >60 mL/min (ref >60)
GLUCOSE: 84 mg/dL (ref 65–99)
Potassium: 3.4 mmol/L — ABNORMAL LOW (ref 3.5–5.1)
SODIUM: 135 mmol/L (ref 135–145)
Total Bilirubin: 0.3 mg/dL (ref 0.3–1.2)
Total Protein: 7 g/dL (ref 6.5–8.1)

## 2016-02-26 LAB — PROTEIN / CREATININE RATIO, URINE
CREATININE, URINE: 269 mg/dL
Protein Creatinine Ratio: 0.26 mg/mg{Cre} — ABNORMAL HIGH (ref 0.00–0.15)
TOTAL PROTEIN, URINE: 69 mg/dL

## 2016-02-26 LAB — CBC
HEMATOCRIT: 31.9 % — AB (ref 36.0–46.0)
HEMOGLOBIN: 11.2 g/dL — AB (ref 12.0–15.0)
MCH: 29.2 pg (ref 26.0–34.0)
MCHC: 35.1 g/dL (ref 30.0–36.0)
MCV: 83.3 fL (ref 78.0–100.0)
Platelets: 206 10*3/uL (ref 150–400)
RBC: 3.83 MIL/uL — ABNORMAL LOW (ref 3.87–5.11)
RDW: 13.3 % (ref 11.5–15.5)
WBC: 9.9 10*3/uL (ref 4.0–10.5)

## 2016-02-26 LAB — RAPID URINE DRUG SCREEN, HOSP PERFORMED
Amphetamines: NOT DETECTED
BARBITURATES: NOT DETECTED
BENZODIAZEPINES: NOT DETECTED
Cocaine: NOT DETECTED
Opiates: NOT DETECTED
Tetrahydrocannabinol: NOT DETECTED

## 2016-02-26 LAB — TYPE AND SCREEN
ABO/RH(D): B POS
ANTIBODY SCREEN: NEGATIVE

## 2016-02-26 LAB — POCT FERN TEST: POCT Fern Test: NEGATIVE

## 2016-02-26 MED ORDER — BENZOCAINE-MENTHOL 20-0.5 % EX AERO
1.0000 "application " | INHALATION_SPRAY | CUTANEOUS | Status: DC | PRN
  Administered 2016-02-26: 1 via TOPICAL
  Filled 2016-02-26: qty 56

## 2016-02-26 MED ORDER — ONDANSETRON HCL 4 MG/2ML IJ SOLN: 4.0000 mg | Freq: Four times a day (QID) | INTRAMUSCULAR | Status: DC | PRN

## 2016-02-26 MED ORDER — DIBUCAINE 1 % RE OINT: 1.0000 "application " | TOPICAL_OINTMENT | RECTAL | Status: DC | PRN

## 2016-02-26 MED ORDER — TETANUS-DIPHTH-ACELL PERTUSSIS 5-2.5-18.5 LF-MCG/0.5 IM SUSP
0.5000 mL | Freq: Once | INTRAMUSCULAR | Status: AC
  Administered 2016-02-27: 0.5 mL via INTRAMUSCULAR
  Filled 2016-02-26: qty 0.5

## 2016-02-26 MED ORDER — OXYCODONE-ACETAMINOPHEN 5-325 MG PO TABS: 2.0000 | ORAL_TABLET | ORAL | Status: DC | PRN

## 2016-02-26 MED ORDER — COCONUT OIL OIL: 1.0000 "application " | TOPICAL_OIL | Status: DC | PRN

## 2016-02-26 MED ORDER — SENNOSIDES-DOCUSATE SODIUM 8.6-50 MG PO TABS
2.0000 | ORAL_TABLET | ORAL | Status: DC
  Administered 2016-02-26 – 2016-02-27 (×2): 2 via ORAL
  Filled 2016-02-26 (×2): qty 2

## 2016-02-26 MED ORDER — ZOLPIDEM TARTRATE 5 MG PO TABS: 5.0000 mg | ORAL_TABLET | Freq: Every evening | ORAL | Status: DC | PRN

## 2016-02-26 MED ORDER — LIDOCAINE HCL (PF) 1 % IJ SOLN
30.0000 mL | INTRAMUSCULAR | Status: AC | PRN
  Administered 2016-02-26: 30 mL via SUBCUTANEOUS
  Filled 2016-02-26: qty 30

## 2016-02-26 MED ORDER — PRENATAL MULTIVITAMIN CH
1.0000 | ORAL_TABLET | Freq: Every day | ORAL | Status: DC
  Administered 2016-02-27: 1 via ORAL
  Filled 2016-02-26: qty 1

## 2016-02-26 MED ORDER — OXYCODONE-ACETAMINOPHEN 5-325 MG PO TABS: 1.0000 | ORAL_TABLET | ORAL | Status: DC | PRN

## 2016-02-26 MED ORDER — ERYTHROMYCIN 5 MG/GM OP OINT
TOPICAL_OINTMENT | OPHTHALMIC | Status: AC
  Filled 2016-02-26: qty 1

## 2016-02-26 MED ORDER — LACTATED RINGERS IV SOLN
INTRAVENOUS | Status: DC
  Administered 2016-02-26: 04:00:00 via INTRAVENOUS

## 2016-02-26 MED ORDER — OXYTOCIN 40 UNITS IN LACTATED RINGERS INFUSION - SIMPLE MED
2.5000 [IU]/h | INTRAVENOUS | Status: DC
  Filled 2016-02-26: qty 1000

## 2016-02-26 MED ORDER — LABETALOL HCL 5 MG/ML IV SOLN: 20.0000 mg | INTRAVENOUS | Status: DC | PRN

## 2016-02-26 MED ORDER — HYDRALAZINE HCL 20 MG/ML IJ SOLN: 10.0000 mg | Freq: Once | INTRAMUSCULAR | Status: DC | PRN

## 2016-02-26 MED ORDER — OXYCODONE HCL 5 MG PO TABS
5.0000 mg | ORAL_TABLET | ORAL | Status: DC | PRN
  Administered 2016-02-26 – 2016-02-28 (×4): 5 mg via ORAL
  Filled 2016-02-26 (×4): qty 1

## 2016-02-26 MED ORDER — ONDANSETRON HCL 4 MG/2ML IJ SOLN: 4.0000 mg | INTRAMUSCULAR | Status: DC | PRN

## 2016-02-26 MED ORDER — SIMETHICONE 80 MG PO CHEW: 80.0000 mg | CHEWABLE_TABLET | ORAL | Status: DC | PRN

## 2016-02-26 MED ORDER — OXYTOCIN BOLUS FROM INFUSION
500.0000 mL | Freq: Once | INTRAVENOUS | Status: AC
  Administered 2016-02-26: 500 mL via INTRAVENOUS

## 2016-02-26 MED ORDER — SOD CITRATE-CITRIC ACID 500-334 MG/5ML PO SOLN: 30.0000 mL | ORAL | Status: DC | PRN

## 2016-02-26 MED ORDER — WITCH HAZEL-GLYCERIN EX PADS: 1.0000 "application " | MEDICATED_PAD | CUTANEOUS | Status: DC | PRN

## 2016-02-26 MED ORDER — ONDANSETRON HCL 4 MG PO TABS: 4.0000 mg | ORAL_TABLET | ORAL | Status: DC | PRN

## 2016-02-26 MED ORDER — FLEET ENEMA 7-19 GM/118ML RE ENEM: 1.0000 | ENEMA | RECTAL | Status: DC | PRN

## 2016-02-26 MED ORDER — LACTATED RINGERS IV SOLN: 500.0000 mL | INTRAVENOUS | Status: DC | PRN

## 2016-02-26 MED ORDER — ACETAMINOPHEN 325 MG PO TABS
650.0000 mg | ORAL_TABLET | ORAL | Status: DC | PRN
Start: 2016-02-26 — End: 2016-02-28
  Administered 2016-02-26: 650 mg via ORAL
  Filled 2016-02-26: qty 2

## 2016-02-26 MED ORDER — ACETAMINOPHEN 325 MG PO TABS: 650.0000 mg | ORAL_TABLET | ORAL | Status: DC | PRN

## 2016-02-26 MED ORDER — FENTANYL CITRATE (PF) 100 MCG/2ML IJ SOLN
100.0000 ug | INTRAMUSCULAR | Status: DC | PRN
  Administered 2016-02-26 (×2): 100 ug via INTRAVENOUS
  Filled 2016-02-26 (×2): qty 2

## 2016-02-26 MED ORDER — DIPHENHYDRAMINE HCL 25 MG PO CAPS: 25.0000 mg | ORAL_CAPSULE | Freq: Four times a day (QID) | ORAL | Status: DC | PRN

## 2016-02-26 MED ORDER — IBUPROFEN 600 MG PO TABS
600.0000 mg | ORAL_TABLET | Freq: Four times a day (QID) | ORAL | Status: DC
  Administered 2016-02-26 – 2016-02-28 (×8): 600 mg via ORAL
  Filled 2016-02-26 (×8): qty 1

## 2016-02-26 NOTE — Progress Notes (Signed)
IV not charted, saline locked at 0831.

## 2016-02-26 NOTE — MAU Note (Signed)
CHARGE    RN  SAYS  CANNOT  TAKE  PT   AT  THIS  TIME.

## 2016-02-26 NOTE — Progress Notes (Signed)
Notified of pt arrival in MAU and exam with pertinent history. Will admit to labor and delivery

## 2016-02-26 NOTE — Progress Notes (Signed)
Saline lock IV removed with catheter intact per orders. Patient tolerated well.

## 2016-02-26 NOTE — MAU Note (Signed)
Pt presents via EMS complaining of contractions every 6 minutes since midnight. Leaking fluid as well. Denies bleeding. Reports good fetal movement. No PNC. Hx of pre-eclampsia and c/s.

## 2016-02-26 NOTE — H&P (Signed)
LABOR AND DELIVERY ADMISSION HISTORY AND PHYSICAL NOTE  Kimberly [name] is a 25 y.o. female [phone] with IUP at 5829w5d by LMP presenting for active labor. Kimberly [name] has had very limited PNC. She states that she has had h/o Pre-E with a prior preg as well as prior placental abruption  She reports positive fetal movement. She denies leakage of fluid or vaginal bleeding.  Prenatal History/Complications:  Past Medical History: Past Medical History:  Diagnosis Date  . Anxiety    because of last preg,scared something is wrong  . Chlamydia   . Constipation   . Depression    after loss of preg/child  . Gonorrhea   . Hypertension   . Pregnancy induced hypertension     Past Surgical History: Past Surgical History:  Procedure Laterality Date  . CESAREAN SECTION N/A 03/20/2013   Procedure: CESAREAN SECTION;  Surgeon: Kathreen CosierBernard A Marshall, MD;  Location: [facility];  Service: Obstetrics;  Laterality: N/A;    Obstetrical History: OB History    Gravida Para Term Preterm AB Living   4 3 3  0 0 2   SAB TAB Ectopic Multiple Live Births   0 0 0 0 3      Social History: Social History   Social History  . Marital status: Single    Spouse name: N/A  . Number of children: N/A  . Years of education: N/A   Social History Main Topics  . Smoking status: Never Smoker  . Smokeless tobacco: Never Used  . Alcohol use No  . Drug use: No  . Sexual activity: Yes    Birth control/ protection: None   Other Topics Concern  . None   Social History Narrative  . None    Family History: Family History  Problem Relation Age of Onset  . Asthma Son   . Cancer Neg Hx   . Diabetes Neg Hx   . Heart disease Neg Hx   . Hypertension Neg Hx   . Stroke Neg Hx   . Hearing loss Neg Hx     Allergies: No Known Allergies  Prescriptions Prior to Admission  Medication Sig Dispense Refill Last Dose  . ibuprofen (ADVIL,MOTRIN) 600 MG tablet Take 1 tablet (600 mg total) by mouth every 6 (six) hours. (Patient  not taking: Reported on 04/16/2015) 30 tablet 0 Not Taking at Unknown time  . Prenatal Vit-Fe Fumarate-FA (PRENATAL VITAMINS PLUS) 27-1 MG TABS Take 1 tablet by mouth daily. 30 tablet 6      Review of Systems   All systems reviewed and negative except as stated in HPI  Blood pressure (!) 155/93, pulse 95, temperature 98.3 F (36.8 C), temperature source Oral, resp. rate 18, height 5' 3.5" (1.613 m), weight 240 lb (108.9 kg), last menstrual period 04/15/2014, unknown if currently breastfeeding. General appearance: alert, cooperative and no distress Lungs: clear to auscultation bilaterally Heart: regular rate and rhythm Abdomen: soft, non-tender; bowel sounds normal Extremities: No calf swelling or tenderness Presentation: cephalic Fetal monitoring: 130 bpm, Mod variability, accels, no decels Uterine activity: Mod ctx 2-3 min, 60-120 sec each Dilation: 8 Effacement (%): 70 Station: -2 Exam by:: Sharen Hintaroline Brewer RNC   Prenatal labs: ABO, Rh: --/--/B POS (08/11 0335) Antibody: NEG (08/11 0335) Rubella: !Error! RPR: NON REAC (04/05 1430)  HBsAg: NEGATIVE (04/05 1430)  HIV: NONREACTIVE (04/05 1430)  GBS:   NA 1 hr Glucola: 3rd Tr GTT = 135 Genetic screening:  NA Anatomy US: Nl girl  Prenatal Transfer Tool  Maternal  Diabetes: No Genetic Screening: Declined Maternal Ultrasounds/Referrals: Normal Fetal Ultrasounds or other Referrals:  None Maternal Substance Abuse: UDS pending Significant Maternal Medications: see above Significant Maternal Lab Results: GBS unavailable at time of delivery Results for orders placed or performed during the hospital encounter of 02/26/16 (from the past 24 hour(s))  Fern Test     Status: Normal   Collection Time: 02/26/16  2:49 AM  Result Value Ref Range   POCT Fern Test Negative = intact amniotic membranes   CBC     Status: Abnormal   Collection Time: 02/26/16  3:35 AM  Result Value Ref Range   WBC 9.9 4.0 - 10.5 K/uL   RBC 3.83 (L) 3.87 -  5.11 MIL/uL   Hemoglobin 11.2 (L) 12.0 - 15.0 g/dL   HCT 16.1 (L) 09.6 - 04.5 %   MCV 83.3 78.0 - 100.0 fL   MCH 29.2 26.0 - 34.0 pg   MCHC 35.1 30.0 - 36.0 g/dL   RDW 40.9 81.1 - 91.4 %   Platelets 206 150 - 400 K/uL  Type and screen [facility] [place]     Status: None   Collection Time: 02/26/16  3:35 AM  Result Value Ref Range   ABO/RH(D) B POS    Antibody Screen NEG    Sample Expiration 02/29/2016   Comprehensive metabolic panel     Status: Abnormal   Collection Time: 02/26/16  3:35 AM  Result Value Ref Range   Sodium 135 135 - 145 mmol/L   Potassium 3.4 (L) 3.5 - 5.1 mmol/L   Chloride 105 101 - 111 mmol/L   CO2 22 22 - 32 mmol/L   Glucose, Bld 84 65 - 99 mg/dL   BUN 9 6 - 20 mg/dL   Creatinine, Ser 7.82 0.44 - 1.00 mg/dL   Calcium 9.1 8.9 - 95.6 mg/dL   Total Protein 7.0 6.5 - 8.1 g/dL   Albumin 3.1 (L) 3.5 - 5.0 g/dL   AST 15 15 - 41 U/L   ALT 12 (L) 14 - 54 U/L   Alkaline Phosphatase 159 (H) 38 - 126 U/L   Total Bilirubin 0.3 0.3 - 1.2 mg/dL   GFR calc non Af Amer >60 >60 mL/min   GFR calc Af Amer >60 >60 mL/min   Anion gap 8 5 - 15    Results for orders placed or performed during the hospital encounter of 02/26/16 (from the past 24 hour(s))  Fern Test   Collection Time: 02/26/16  2:49 AM  Result Value Ref Range   POCT Fern Test Negative = intact amniotic membranes   CBC   Collection Time: 02/26/16  3:35 AM  Result Value Ref Range   WBC 9.9 4.0 - 10.5 K/uL   RBC 3.83 (L) 3.87 - 5.11 MIL/uL   Hemoglobin 11.2 (L) 12.0 - 15.0 g/dL   HCT 21.3 (L) 08.6 - 57.8 %   MCV 83.3 78.0 - 100.0 fL   MCH 29.2 26.0 - 34.0 pg   MCHC 35.1 30.0 - 36.0 g/dL   RDW 46.9 62.9 - 52.8 %   Platelets 206 150 - 400 K/uL  Comprehensive metabolic panel   Collection Time: 02/26/16  3:35 AM  Result Value Ref Range   Sodium 135 135 - 145 mmol/L   Potassium 3.4 (L) 3.5 - 5.1 mmol/L   Chloride 105 101 - 111 mmol/L   CO2 22 22 - 32 mmol/L   Glucose, Bld 84 65 - 99 mg/dL    BUN 9 6 -  20 mg/dL   Creatinine, Ser 1.61 0.44 - 1.00 mg/dL   Calcium 9.1 8.9 - 09.6 mg/dL   Total Protein 7.0 6.5 - 8.1 g/dL   Albumin 3.1 (L) 3.5 - 5.0 g/dL   AST 15 15 - 41 U/L   ALT 12 (L) 14 - 54 U/L   Alkaline Phosphatase 159 (H) 38 - 126 U/L   Total Bilirubin 0.3 0.3 - 1.2 mg/dL   GFR calc non Af Amer >60 >60 mL/min   GFR calc Af Amer >60 >60 mL/min   Anion gap 8 5 - 15  Type and screen [facility] [place]   Collection Time: 02/26/16  3:35 AM  Result Value Ref Range   ABO/RH(D) B POS    Antibody Screen NEG    Sample Expiration 02/29/2016     Patient Active Problem List   Diagnosis Date Noted  . Normal labor and delivery 02/26/2016  . Hypertension affecting pregnancy in third trimester 09/20/2014  . Prior pregnancy with fetal demise   . Third trimester pregnancy   . [redacted] weeks gestation of pregnancy   . Supervision of high risk pregnancy in third trimester 09/15/2014  . Encounter for fetal anatomic survey   . [redacted] weeks gestation of pregnancy   . Limited prenatal care in second trimester   . Hx of intrauterine fetal death, currently pregnant   . Pain aggravated by activities of daily living 03/27/2013  . Unspecified hypertension, with delivery, with current postpartum complication 03/27/2013  . Fetal demise > 22 weeks, delivered, current hospitalization 03/21/2013  . Chlamydia trachomatis infection in pregnancy 11/15/2012    Assessment: Kimberly [name] is a 25 y.o. [phone] at [redacted]w[redacted]d here for active labor  #limited pre-natal care: will check UDS #Pre-E/HTN Protocol given hx of prior pregnancies #Labor: Expectant mgmt #Pain: IV fentanyl #FWB: Cat I #ID:  GBS status unknown #MOF: Bottle #MOC: Undecided   Andres Ege, MD, PGY-1, MPH 02/26/2016, 5:49 AM   OB FELLOW HISTORY AND PHYSICAL ATTESTATION  I have seen and examined this patient; I agree with above documentation in the resident's note.    Ernestina Penna 02/26/2016, 7:49 AM

## 2016-02-28 MED ORDER — IBUPROFEN 600 MG PO TABS: 600.0000 mg | ORAL_TABLET | Freq: Four times a day (QID) | ORAL | 0 refills | Status: DC

## 2016-02-28 NOTE — Discharge Summary (Signed)
OB Discharge Summary  Patient Name: Kimberly [name] DOB: [birth year] MRN: [id number]  Date of admission: 02/26/2016 Delivering MD: Lorne SkeensSCHENK, NICHOLAS MICHAEL   Date of discharge: 02/28/2016  Admitting diagnosis: 40 WEEKS CTX Intrauterine pregnancy: 2823w5d     Secondary diagnosis:Active Problems:   Normal labor and delivery  Additional problems:none     Discharge diagnosis: Term Pregnancy Delivered and limited [facility]                                                                     Post partum procedures:n/a  Augmentation: n/a  Complications: None  Hospital course:  Onset of Labor With Vaginal Delivery     25 y.o. yo G4P4003 at 8323w5d was admitted in Active Labor on 02/26/2016. Patient had an uncomplicated labor course as follows:  Membrane Rupture Time/Date: 7:12 AM ,02/26/2016   Intrapartum Procedures: Episiotomy: None [1]                                         Lacerations:  2nd degree [3]  Patient had a delivery of a Viable infant. 02/26/2016  Information for the patient's newborn:  Threasa HeadsSnipes, Girl Zeda [[id number]][[id number]]  Delivery Method: VBAC, Spontaneous (Filed from Delivery Summary)    Pateint had an uncomplicated postpartum course.  She is ambulating, tolerating a regular diet, passing flatus, and urinating well. Patient is discharged home in stable condition on 02/28/16.    Physical exam Vitals:   02/26/16 2043 02/27/16 0521 02/27/16 1814 02/28/16 0535  BP: 128/62 125/79 134/62 119/62  Pulse: 84 73 85 69  Resp: 18 18 19 18   Temp: 98.4 F (36.9 C) 98.1 F (36.7 C) 98.4 F (36.9 C) 98.3 F (36.8 C)  TempSrc: Oral Oral Oral   SpO2: 98%     Weight:      Height:       General: alert, cooperative and no distress Lochia: appropriate Uterine Fundus: firm Incision: N/A DVT Evaluation: No evidence of DVT seen on physical exam. Negative Homan's sign. No cords or calf tenderness. Labs: Lab Results  Component Value Date   WBC 9.9 02/26/2016   HGB 11.2 (L) 02/26/2016    HCT 31.9 (L) 02/26/2016   MCV 83.3 02/26/2016   PLT 206 02/26/2016   CMP Latest Ref Rng & Units 02/26/2016  Glucose 65 - 99 mg/dL 84  BUN 6 - 20 mg/dL 9  Creatinine 0.860.44 - 5.781.00 mg/dL 4.690.57  Sodium 629135 - 528145 mmol/L 135  Potassium 3.5 - 5.1 mmol/L 3.4(L)  Chloride 101 - 111 mmol/L 105  CO2 22 - 32 mmol/L 22  Calcium 8.9 - 10.3 mg/dL 9.1  Total Protein 6.5 - 8.1 g/dL 7.0  Total Bilirubin 0.3 - 1.2 mg/dL 0.3  Alkaline Phos 38 - 126 U/L 159(H)  AST 15 - 41 U/L 15  ALT 14 - 54 U/L 12(L)    Discharge instruction: per After Visit Summary and "Baby and Me Booklet".  After Visit Meds:    Medication List    TAKE these medications   ibuprofen 600 MG tablet Commonly known as:  ADVIL,MOTRIN Take 1 tablet (600 mg total) by mouth every  6 (six) hours.   PRENATAL VITAMINS PLUS 27-1 MG Tabs Take 1 tablet by mouth daily.       Diet: routine diet  Activity: Advance as tolerated. Pelvic rest for 6 weeks.   Outpatient follow up:6 weeks Follow up Appt:No future appointments. Follow up visit: No Follow-up on file.  Postpartum contraception: Depo Provera  Newborn Data: Live born female  Birth Weight: 7 lb 6 oz (3345 g) APGAR: 8, 9  Baby Feeding: Bottle and Breast Disposition:home with mother   02/28/2016 Wyvonnia Dusky, CNM

## 2016-02-28 NOTE — Clinical Social Work Maternal (Signed)
  CLINICAL SOCIAL WORK MATERNAL/CHILD NOTE  Patient Details  Name: Kimberly [name] MRN: [id number] Date of Birth: Dec 20, 1990  Date:  02/28/2016  Clinical Social Worker Initiating Note:  Rigoberto Noel, LCSW Date/ Time Initiated:  02/21/16/0902     Child's Name:  Kimberly [name]   Legal Guardian:  Mother   Need for Interpreter:  None   Date of Referral:  02/26/16     Reason for Referral:  Late or No Prenatal Care    Referral Source:  Central Nursery   Address:  [place]  Phone number:  [phone]   Household Members:  Self, Minor Children   Natural Supports (not living in the home):  Extended Family, Friends   Chiropodist: None   Employment: Unemployed   Type of Work: NA   Education:  Database administrator Resources:  Medicaid   Other Resources:  Physicist, medical , [place] Considerations Which May Impact Care:  None reported  Strengths:  Ability to meet basic needs , Home prepared for child , Pediatrician chosen    Risk Factors/Current Problems:  None   Cognitive State:  Alert    Mood/Affect:  Calm    CSW Assessment: CSW met with [name] to complete assessment. CSW made [name] aware of hospital's policy due to late prenatal care. [name] verbalized understanding. When prompted about why she received late prenatal care [name] responded "I don't know. I just didn't." [name] denied hx of THC use. [name] denied use of any substances during pregnancy. [name] reported that she had basic needs for self and baby when returning home. She reported that she has supportive family and friends. [name] declined information to any community resources.   CSW Plan/Description:  No Further Intervention Required/No Barriers to Discharge    Essie Christine, LCSW 02/28/2016, 9:06 AM

## 2016-03-07 ENCOUNTER — Encounter (HOSPITAL_COMMUNITY): Payer: Self-pay | Admitting: Emergency Medicine

## 2016-03-07 ENCOUNTER — Encounter: Payer: Medicaid Other | Admitting: Obstetrics and Gynecology

## 2016-03-07 ENCOUNTER — Emergency Department (HOSPITAL_COMMUNITY)
Admission: EM | Admit: 2016-03-07 | Discharge: 2016-03-07 | Disposition: A | Discharge status: Home / Self Care | Payer: Medicaid Other | Attending: Emergency Medicine | Admitting: Emergency Medicine

## 2016-03-07 DIAGNOSIS — I159 Secondary hypertension, unspecified: Secondary | ICD-10-CM

## 2016-03-07 DIAGNOSIS — R040 Epistaxis: Secondary | ICD-10-CM

## 2016-03-07 LAB — URINALYSIS, ROUTINE W REFLEX MICROSCOPIC
Bilirubin Urine: NEGATIVE
GLUCOSE, UA: NEGATIVE mg/dL
Hgb urine dipstick: NEGATIVE
Ketones, ur: NEGATIVE mg/dL
LEUKOCYTES UA: NEGATIVE
NITRITE: NEGATIVE
PROTEIN: NEGATIVE mg/dL
Specific Gravity, Urine: 1.023 (ref 1.005–1.030)
pH: 5.5 (ref 5.0–8.0)

## 2016-03-07 LAB — COMPREHENSIVE METABOLIC PANEL
ALBUMIN: 3.4 g/dL — AB (ref 3.5–5.0)
ALT: 27 U/L (ref 14–54)
ANION GAP: 7 (ref 5–15)
AST: 20 U/L (ref 15–41)
Alkaline Phosphatase: 94 U/L (ref 38–126)
BILIRUBIN TOTAL: 0.4 mg/dL (ref 0.3–1.2)
BUN: 14 mg/dL (ref 6–20)
CO2: 22 mmol/L (ref 22–32)
Calcium: 9.3 mg/dL (ref 8.9–10.3)
Chloride: 109 mmol/L (ref 101–111)
Creatinine, Ser: 0.97 mg/dL (ref 0.44–1.00)
GFR calc non Af Amer: >60 mL/min (ref >60)
GLUCOSE: 87 mg/dL (ref 65–99)
POTASSIUM: 3.6 mmol/L (ref 3.5–5.1)
Sodium: 138 mmol/L (ref 135–145)
TOTAL PROTEIN: 6.9 g/dL (ref 6.5–8.1)

## 2016-03-07 LAB — CBC WITH DIFFERENTIAL/PLATELET
BASOS PCT: 0 %
Basophils Absolute: 0 10*3/uL (ref 0.0–0.1)
EOS ABS: 0.1 10*3/uL (ref 0.0–0.7)
Eosinophils Relative: 1 %
HEMATOCRIT: 38.3 % (ref 36.0–46.0)
Hemoglobin: 12.7 g/dL (ref 12.0–15.0)
Lymphocytes Relative: 27 %
Lymphs Abs: 2.5 10*3/uL (ref 0.7–4.0)
MCH: 29.3 pg (ref 26.0–34.0)
MCHC: 33.2 g/dL (ref 30.0–36.0)
MCV: 88.2 fL (ref 78.0–100.0)
MONO ABS: 0.6 10*3/uL (ref 0.1–1.0)
MONOS PCT: 7 %
NEUTROS ABS: 6.1 10*3/uL (ref 1.7–7.7)
Neutrophils Relative %: 65 %
Platelets: 351 10*3/uL (ref 150–400)
RBC: 4.34 MIL/uL (ref 3.87–5.11)
RDW: 12.5 % (ref 11.5–15.5)
WBC: 9.3 10*3/uL (ref 4.0–10.5)

## 2016-03-07 NOTE — Discharge Instructions (Signed)
1. Medications: usual home medications 2. Treatment: rest, drink plenty of fluids,  3. Follow Up: Please followup with your primary doctor or OB/GYN in 1-2 days for discussion of your diagnoses and further evaluation after today's visit; if you do not have a primary care doctor use the resource guide provided to find one; Please return to the ER for worsening symptoms, return of your bleeding, development of headache, vision, changes, chest pain, SOB or other concerns

## 2016-03-07 NOTE — ED Triage Notes (Signed)
Per [facility]  Changing her childs diaper  Bilateral Nosebleed. Held pressure and it did not stop bleeding. Spurting with EMS. Pt was Eclamptic with no treatment. No significant medical history. Birth 8 days ago. Bleeding is controlled now. Blurred vision in her L eye. Pt then looked in the mirror and saw the blood draining from her left eye.  160/100

## 2016-03-07 NOTE — ED Provider Notes (Signed)
[facility] Provider Note   CSN: [id number] Arrival date & time: 03/07/16  0211     History   Chief Complaint Chief Complaint  Patient presents with  . Epistaxis    HPI Kimberly [name] is a 25 y.o. female.  Kimberly [name] is a 25 y.o. female  with a hx of pre-eclampsia/HTN presents to the Emergency Department complaining of acute, persistent,improving onset 1hour PTA.  Pt reports she bent over to change her daughter when her nose began bleeding.  Pt reports she is 8 days s/p vaginal delivery of a full term healthy girl.  She has minimal prenatal care.  She reports a hx of pre-eclampsia with previous pregnancies, but denies ever having a seizure or needing medication.  Record review shows pt was normotensive post partum.  She denies a hx of HELLP syndrome.  Pt denies trauma or cocaine usage.  Pt is not taking any blood thinners.  Pt reports using direct pressure without resolution of the bleeding.  Pt denies fever, chills headache, neck pain, headache, vision changes, abd pain, N/V/D.      The history is provided by the patient and medical records. No language interpreter was used.    Past Medical History:  Diagnosis Date  . Anxiety    because of last preg,scared something is wrong  . Chlamydia   . Constipation   . Depression    after loss of preg/child  . Gonorrhea   . Hypertension   . Pregnancy induced hypertension     Patient Active Problem List   Diagnosis Date Noted  . Normal labor and delivery 02/26/2016  . Hypertension affecting pregnancy in third trimester 09/20/2014  . Prior pregnancy with fetal demise   . Third trimester pregnancy   . [redacted] weeks gestation of pregnancy   . Supervision of high risk pregnancy in third trimester 09/15/2014  . Encounter for fetal anatomic survey   . [redacted] weeks gestation of pregnancy   . Limited prenatal care in second trimester   . Hx of intrauterine fetal death, currently pregnant   . Pain aggravated by activities of daily  living 03/27/2013  . Unspecified hypertension, with delivery, with current postpartum complication 03/27/2013  . Fetal demise > 22 weeks, delivered, current hospitalization 03/21/2013  . Chlamydia trachomatis infection in pregnancy 11/15/2012    Past Surgical History:  Procedure Laterality Date  . CESAREAN SECTION N/A 03/20/2013   Procedure: CESAREAN SECTION;  Surgeon: Kathreen Cosier, MD;  Location: [facility];  Service: Obstetrics;  Laterality: N/A;    OB History    Gravida Para Term Preterm AB Living   4 4 4  0 0 3   SAB TAB Ectopic Multiple Live Births   0 0 0 0 3       Home Medications    Prior to Admission medications   Medication Sig Start Date End Date Taking? Authorizing Provider  ibuprofen (ADVIL,MOTRIN) 600 MG tablet Take 1 tablet (600 mg total) by mouth every 6 (six) hours. Patient not taking: Reported on 03/07/2016 02/28/16   Montez Morita, CNM    Family History Family History  Problem Relation Age of Onset  . Asthma Son   . Cancer Neg Hx   . Diabetes Neg Hx   . Heart disease Neg Hx   . Hypertension Neg Hx   . Stroke Neg Hx   . Hearing loss Neg Hx     Social History Social History  Substance Use Topics  . Smoking status: Never Smoker  .  Smokeless tobacco: Never Used  . Alcohol use No     Allergies   Review of patient's allergies indicates no known allergies.   Review of Systems Review of Systems  HENT: Positive for nosebleeds.   All other systems reviewed and are negative.    Physical Exam Updated Vital Signs BP 136/76   Pulse 73   Temp 98.9 F (37.2 C) (Oral)   Resp 20   LMP 04/15/2014   SpO2 99%   Physical Exam  Constitutional: She appears well-developed and well-nourished. No distress.  Awake, alert, nontoxic appearance  HENT:  Head: Normocephalic and atraumatic.  Nose: Epistaxis is observed.  Mouth/Throat: Oropharynx is clear and moist. No oropharyngeal exudate.  Nose cleared of clots and nasal speculum used to visualize  bilateral nares, turbinates and septum. Small punctate lesion on the septum inside the left nare. Hemostasis achieved.  Eyes: Conjunctivae are normal. No scleral icterus.  Neck: Normal range of motion. Neck supple.  Cardiovascular: Normal rate, regular rhythm and intact distal pulses.   Pulmonary/Chest: Effort normal and breath sounds normal. No respiratory distress. She has no wheezes.  Equal chest expansion  Abdominal: Soft. Bowel sounds are normal. She exhibits no mass. There is no tenderness. There is no rebound and no guarding.  Musculoskeletal: Normal range of motion. She exhibits no edema.  Neurological: She is alert.  Speech is clear and goal oriented Moves extremities without ataxia  Skin: Skin is warm and dry. She is not diaphoretic.  Psychiatric: She has a normal mood and affect.  Nursing note and vitals reviewed.    ED Treatments / Results  Labs (all labs ordered are listed, but only abnormal results are displayed) Labs Reviewed  COMPREHENSIVE METABOLIC PANEL - Abnormal; Notable for the following:       Result Value   Albumin 3.4 (*)    All other components within normal limits  CBC WITH DIFFERENTIAL/PLATELET  URINALYSIS, ROUTINE W REFLEX MICROSCOPIC (NOT AT [facility])    EKG  EKG Interpretation None       Radiology No results found.  Procedures .Epistaxis Management Date/Time: 03/07/2016 4:00 AM Performed by: Dierdre ForthMUTHERSBAUGH, Addie Alonge Authorized by: Dierdre ForthMUTHERSBAUGH, Pradeep Beaubrun   Consent:    Consent obtained:  Verbal   Consent given by:  Patient   Risks discussed:  Bleeding and pain   Alternatives discussed:  No treatment and observation Anesthesia (see MAR for exact dosages):    Anesthesia method:  None Procedure details:    Treatment site:  L septum   Treatment method:  Silver nitrate   Treatment complexity:  Limited   Treatment episode: initial   Post-procedure details:    Assessment:  Bleeding stopped   Patient tolerance of procedure:  Tolerated well, no  immediate complications Comments:     Punctate lesion located on the septum inside the left knee are treated with silver nitrate.   (including critical care time)  Medications Ordered in ED Medications - No data to display   Initial Impression / Assessment and Plan / ED Course  I have reviewed the triage vital signs and the nursing notes.  Pertinent labs & imaging results that were available during my care of the patient were reviewed by me and considered in my medical decision making (see chart for details).  Clinical Course  Value Comment By Time  Platelets: 351 Normal platelets Dierdre ForthHannah Teddie Curd, PA-C 08/21 0419  Hemoglobin: 12.7 No anemia Dierdre ForthHannah Aliany Fiorenza, PA-C 08/21 0419  Creatinine: 0.97 Normal creatinine Dierdre ForthHannah Rubi Tooley, PA-C 08/21 0419  AST: 20  Normal AST/ALT. Dahlia ClientHannah Daphine Loch, PA-C 08/21 [phone]  Protein: NEGATIVE No proteinuria. Dahlia ClientHannah Kamani Lewter, PA-C 08/21 0420   Pt with Epistaxis. Lesion noted and cauterized with silver nitrate.  Patient with normal platelets, no anemia, normal creatinine, normal liver function and urine negative for protein.  Patient initially hypertensive here in the emergency department however believe this was secondary to anxiety. Pressures have normalized without intervention. No evidence of HELLP syndrome.  Pt is well appearing and wishes to be discharged.  She has no headache or blurred vision. She is to f/u with OB/GYN in 2 days for blood pressure recheck. Discussed reasons to return to the emergency department including headache, vision changes or other concerns.  Final Clinical Impressions(s) / ED Diagnoses   Final diagnoses:  Epistaxis  Secondary hypertension, unspecified    New Prescriptions Discharge Medication List as of 03/07/2016  4:42 AM       Dierdre ForthHannah Cashel Bellina, PA-C 03/07/16 0540    Lyndal Pulleyaniel Knott, MD 03/08/16 1301

## 2016-03-21 ENCOUNTER — Emergency Department (HOSPITAL_COMMUNITY)
Admission: EM | Admit: 2016-03-21 | Discharge: 2016-03-21 | Disposition: A | Discharge status: Home / Self Care | Payer: Medicaid Other | Attending: Emergency Medicine | Admitting: Emergency Medicine

## 2016-03-21 ENCOUNTER — Encounter (HOSPITAL_COMMUNITY): Payer: Self-pay | Admitting: Emergency Medicine

## 2016-03-21 DIAGNOSIS — L03114 Cellulitis of left upper limb: Secondary | ICD-10-CM

## 2016-03-21 DIAGNOSIS — L0291 Cutaneous abscess, unspecified: Secondary | ICD-10-CM

## 2016-03-21 DIAGNOSIS — W57XXXA Bitten or stung by nonvenomous insect and other nonvenomous arthropods, initial encounter: Secondary | ICD-10-CM

## 2016-03-21 DIAGNOSIS — L02414 Cutaneous abscess of left upper limb: Secondary | ICD-10-CM | POA: Insufficient documentation

## 2016-03-21 DIAGNOSIS — I10 Essential (primary) hypertension: Secondary | ICD-10-CM | POA: Insufficient documentation

## 2016-03-21 MED ORDER — LIDOCAINE HCL (PF) 1 % IJ SOLN
5.0000 mL | Freq: Once | INTRAMUSCULAR | Status: AC
  Administered 2016-03-21: 5 mL
  Filled 2016-03-21: qty 5

## 2016-03-21 MED ORDER — SULFAMETHOXAZOLE-TRIMETHOPRIM 800-160 MG PO TABS: 1.0000 | ORAL_TABLET | Freq: Two times a day (BID) | ORAL | 0 refills | Status: AC

## 2016-03-21 MED ORDER — CEPHALEXIN 500 MG PO CAPS: 500.0000 mg | ORAL_CAPSULE | Freq: Three times a day (TID) | ORAL | 0 refills | Status: DC

## 2016-03-21 NOTE — ED Triage Notes (Signed)
Pt with possible insect bite to left arm x 5 days; pt is 3 weeks post partum

## 2016-03-21 NOTE — ED Notes (Signed)
Declined W/C at D/C and was escorted to lobby by RN. 

## 2016-03-21 NOTE — Discharge Instructions (Signed)
Take antibiotics as prescribed. Return to the ER in two days for a wound check and for us to remove the packing.

## 2016-03-21 NOTE — ED Provider Notes (Signed)
[facility] Provider Note   CSN: [id number] Arrival date & time: 03/21/16  [id number]    By signing my name below, I, Sonum Patel, attest that this documentation has been prepared under the direction and in the presence of Aizley Stenseth, [place] . Electronically Signed: Sonum Patel, Neurosurgeoncribe. 03/21/16. 10:10 AM.  History   Chief Complaint Chief Complaint  Patient presents with  . Insect Bite    The history is provided by the patient. No language interpreter was used.     HPI Comments: Kimberly [name] is a 25 y.o. female who presents to the Emergency Department complaining of an insect bite to the left forearm that occurred 5 days ago. She initially thought she had a mosquito bite as the lesion initially started as a small itchy bump but states the affected area began to get bigger, open, and drain over the last few days. She denies spending time outside or camping. She states her niece has a similar bite that occurred on the same day; states her niece was started on antibiotics for it. She denies fever, chills.   Past Medical History:  Diagnosis Date  . Anxiety    because of last preg,scared something is wrong  . Chlamydia   . Constipation   . Depression    after loss of preg/child  . Gonorrhea   . Hypertension   . Pregnancy induced hypertension     Patient Active Problem List   Diagnosis Date Noted  . Normal labor and delivery 02/26/2016  . Hypertension affecting pregnancy in third trimester 09/20/2014  . Prior pregnancy with fetal demise   . Third trimester pregnancy   . [redacted] weeks gestation of pregnancy   . Supervision of high risk pregnancy in third trimester 09/15/2014  . Encounter for fetal anatomic survey   . [redacted] weeks gestation of pregnancy   . Limited prenatal care in second trimester   . Hx of intrauterine fetal death, currently pregnant   . Pain aggravated by activities of daily living 03/27/2013  . Unspecified hypertension, with delivery, with current postpartum  complication 03/27/2013  . Fetal demise > 22 weeks, delivered, current hospitalization 03/21/2013  . Chlamydia trachomatis infection in pregnancy 11/15/2012    Past Surgical History:  Procedure Laterality Date  . CESAREAN SECTION N/A 03/20/2013   Procedure: CESAREAN SECTION;  Surgeon: Kathreen CosierBernard A Marshall, MD;  Location: [facility];  Service: Obstetrics;  Laterality: N/A;    OB History    Gravida Para Term Preterm AB Living   4 4 4  0 0 3   SAB TAB Ectopic Multiple Live Births   0 0 0 0 3       Home Medications    Prior to Admission medications   Medication Sig Start Date End Date Taking? Authorizing Provider  ibuprofen (ADVIL,MOTRIN) 600 MG tablet Take 1 tablet (600 mg total) by mouth every 6 (six) hours. Patient not taking: Reported on 03/07/2016 02/28/16   Montez MoritaMarie D Lawson, CNM    Family History Family History  Problem Relation Age of Onset  . Asthma Son   . Cancer Neg Hx   . Diabetes Neg Hx   . Heart disease Neg Hx   . Hypertension Neg Hx   . Stroke Neg Hx   . Hearing loss Neg Hx     Social History Social History  Substance Use Topics  . Smoking status: Never Smoker  . Smokeless tobacco: Never Used  . Alcohol use No     Allergies   Review  of patient's allergies indicates no known allergies.   Review of Systems Review of Systems  10 Systems reviewed and all are negative for acute change except as noted in the HPI.   Physical Exam Updated Vital Signs BP 145/88 (BP Location: Right Arm)   Pulse 92   Temp 98.7 F (37.1 C) (Oral)   Resp 18   LMP 04/15/2014   SpO2 99%   Physical Exam  Constitutional: She is oriented to person, place, and time. She appears well-developed and well-nourished.  HENT:  Head: Normocephalic and atraumatic.  Cardiovascular: Normal rate.   Pulmonary/Chest: Effort normal.  Neurological: She is alert and oriented to person, place, and time.  Skin: Skin is warm and dry. There is erythema.  Left ulnar forearm with 1 cm open wound with  surrounding area of induration and mild erythema. Purulent discharge is easily expressible from the wound. Area is tender. No erythema spreading on arm.   Psychiatric: She has a normal mood and affect.  Nursing note and vitals reviewed.    ED Treatments / Results  DIAGNOSTIC STUDIES: Oxygen Saturation is 99% on RA, normal by my interpretation.    COORDINATION OF CARE: 10:12 AM Discussed treatment plan with pt at bedside and pt agreed to plan.   Labs (all labs ordered are listed, but only abnormal results are displayed) Labs Reviewed - No data to display  EKG  EKG Interpretation None       Radiology No results found.  Procedures Procedures (including critical care time) INCISION AND DRAINAGE Performed by: Noelle Penner Consent: Verbal consent obtained. Risks and benefits: risks, benefits and alternatives were discussed Type: abscess  Body area: left forearm  Anesthesia: local infiltration  Incision was made with a scalpel.  Local anesthetic: lidocaine 1% without epinephrine  Anesthetic total: 2 ml  Complexity: complex Blunt dissection to break up loculations  Drainage: purulent  Drainage amount: moderate  Packing material: 1/4 in iodoform gauze  Patient tolerance: Patient tolerated the procedure well with no immediate complications.     Medications Ordered in ED Medications - No data to display   Initial Impression / Assessment and Plan / ED Course  I have reviewed the triage vital signs and the nursing notes.  Pertinent labs & imaging results that were available during my care of the patient were reviewed by me and considered in my medical decision making (see chart for details).  Clinical Course    Pt presenting with what she thought was an insect bite that developed in a larger, swollen area over the past five days, started draining purulent material yesterday. Niece had a similar lesion and was treated with antibiotics. Due to pt's tenderness I  did locally infiltrate the area with some plain lidocaine and made a small incision to facilitate drainage. Chunky purulent material was easily expressed, area was debrided, and then packed with quarter inch iodoform gauze. Will cover with keflex and bactrim. Instructed to return to the ED in two days for wound check and packing removal.  Final Clinical Impressions(s) / ED Diagnoses   Final diagnoses:  Infected insect bite  Abscess  Cellulitis of left upper extremity    New Prescriptions New Prescriptions   CEPHALEXIN (KEFLEX) 500 MG CAPSULE    Take 1 capsule (500 mg total) by mouth 3 (three) times daily.   SULFAMETHOXAZOLE-TRIMETHOPRIM (BACTRIM DS,SEPTRA DS) 800-160 MG TABLET    Take 1 tablet by mouth 2 (two) times daily.  I personally performed the services described in this documentation, which  was scribed in my presence. The recorded information has been reviewed and is accurate.    Carlene Coria, PA-C 03/21/16 1045    Lorre Nick, MD 03/22/16 [phone]

## 2016-07-18 NOTE — L&D Delivery Note (Signed)
Patient is a 26 y.o. now [id number] s/p NSVD at 6741w6d, who was admitted for IOL for hx fo IUFD at term, and gestational HTN. S/p IOL with FB and Pit. AROM at 00:10, and now s/p VBAC  Delivery Note At 12:50 AM a viable female was delivered via  (Presentation: LOA).  APGAR: 8, 9; weight 7 lb 13.4 oz (3555 g).   Placenta status: intact.  Cord: 3-vessel  Anesthesia:  Epidural Episiotomy: None Lacerations: None Suture Repair: none Est. Blood Loss (mL): 150  Mom to postpartum.  Baby to Couplet care / Skin to Skin.  Raynelle FanningJulie P. Degele, MD OB Fellow 05/07/17, 4:20 AM

## 2016-08-20 IMAGING — US US OB COMP +14 WK
1 series · 12 of 28 positions shown · non-contrast
Comparison: none

[Series 1: us ob comp +14 wk mfm · 86 acquisitions, 12 frames shown]
[im 4/86]
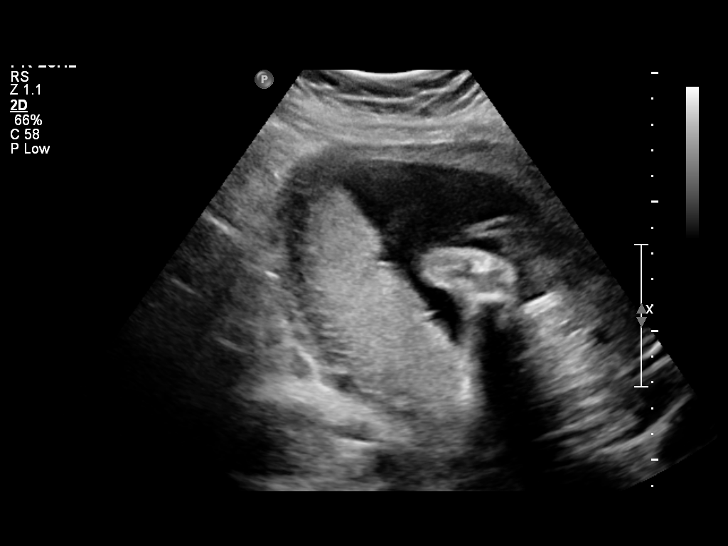
[im 10/86]
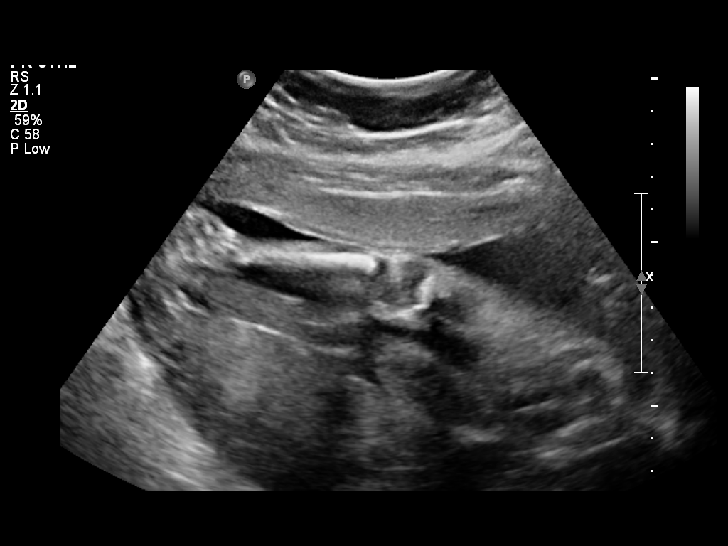
[im 16/86]
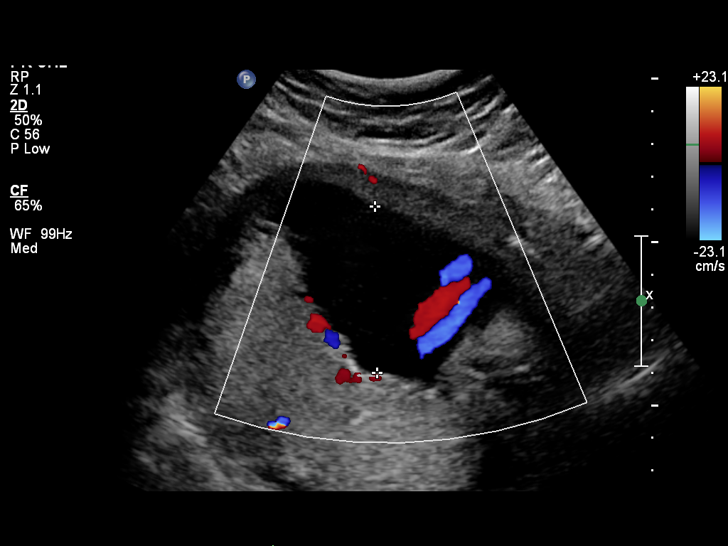
[im 26/86]
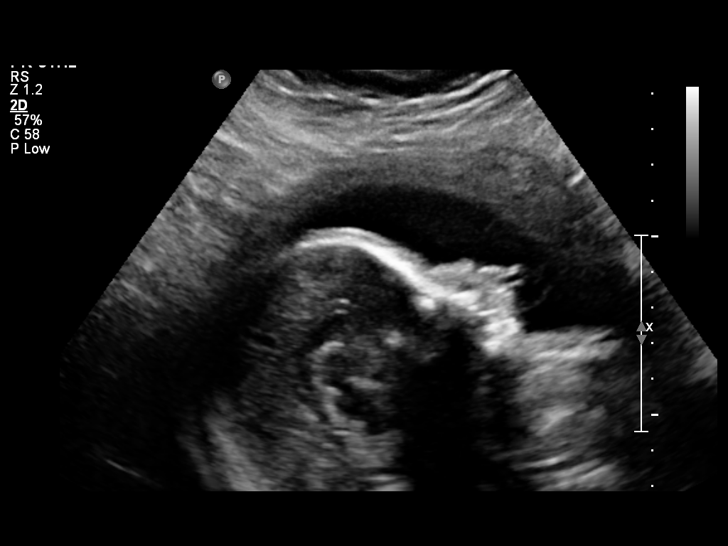
[im 32/86]
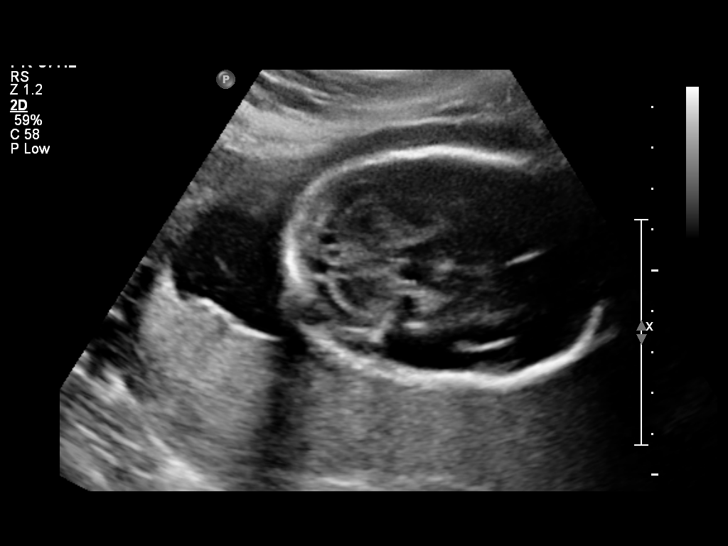
[im 38/86]
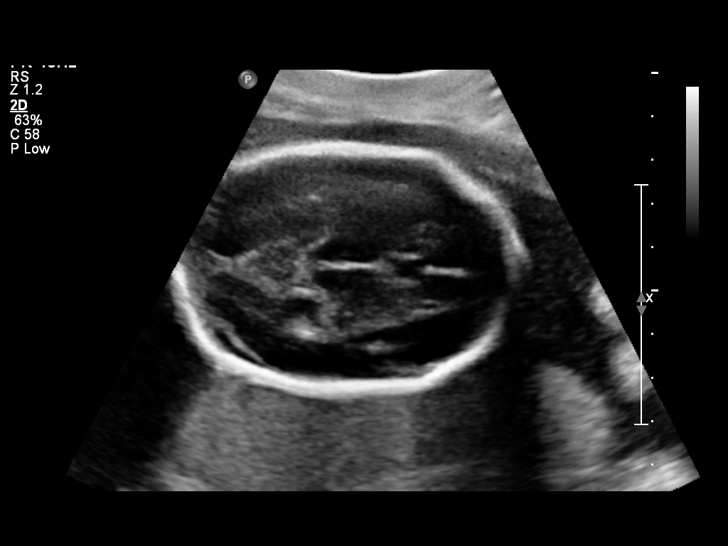
[im 48/86]
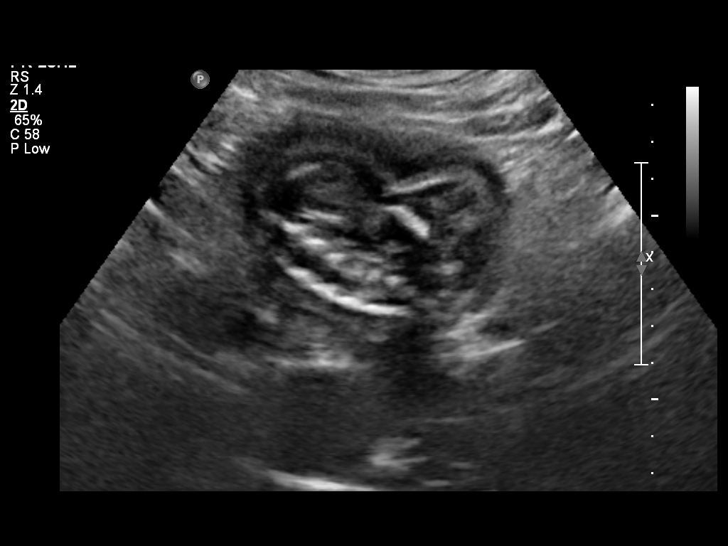
[im 54/86]
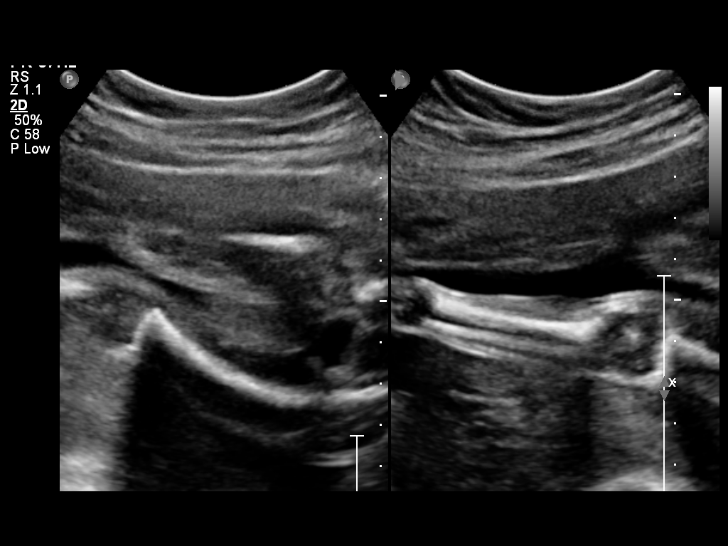
[im 60/86]
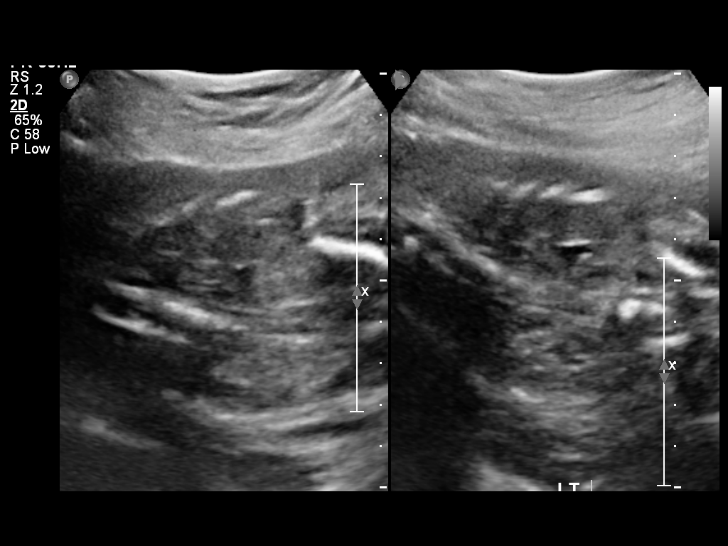
[im 70/86]
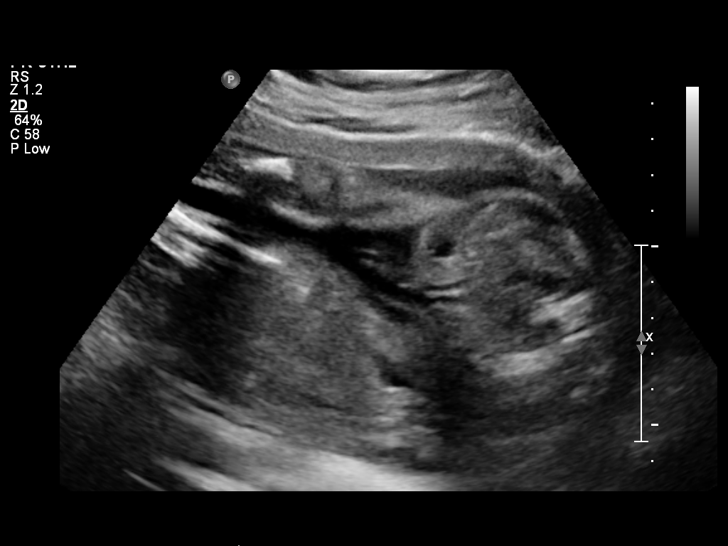
[im 76/86]
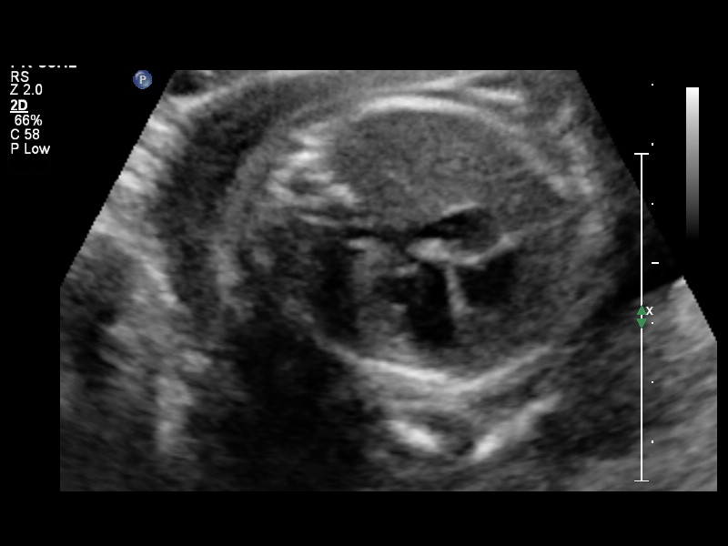
[im 82/86]
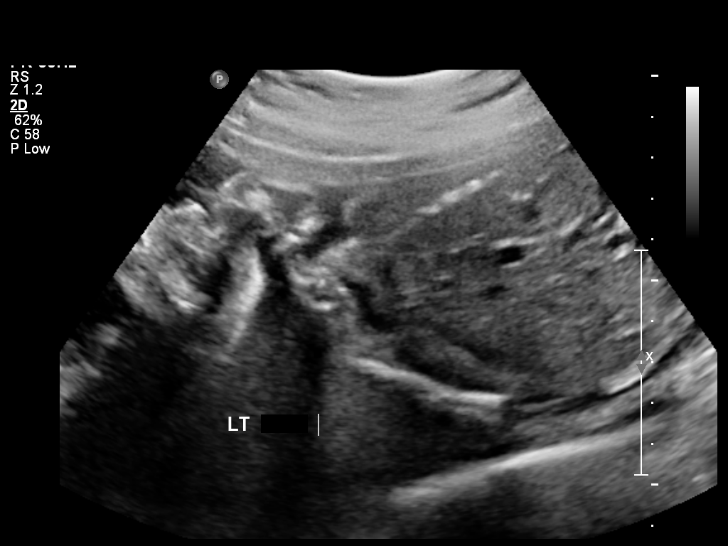

[12 of 28 positions shown; findings below may reference images not displayed]

OBSTETRICS REPORT
                      (Signed Final 06/25/2014 [DATE])

Service(s) Provided

 US OB COMP + 14 WK                                    76805.1
Indications

 Uncertain LMP  Establish Gestational Age              Z36
 Basic anatomic survey                                 z36
 Poor obstetric history: Previous IUFD (38 weeks)
 Previous cesarean delivery (Emergent secondary
 to abruption)
 Poor obstetric history: Previous gestational HTN
 No or Little Prenatal Care
Fetal Evaluation

 Num Of Fetuses:    1
 Fetal Heart Rate:  167                          bpm
 Cardiac Activity:  Observed
 Presentation:      Frank breech
 Placenta:          Posterior, above cervical
                    os
 P. Cord            Visualized, central
 Insertion:

 Amniotic Fluid
 AFI FV:      Subjectively within normal limits
                                              Larg Pckt:    5.1  cm
Biometry

 BPD:     56.1  mm     G. Age:  23w 1d                CI:        62.59   70 - 86
                                                      FL/HC:       20.5  18.7 -

 HC:     228.8  mm     G. Age:  24w 6d       37   %   HC/AC:       1.11  1.04 -

 AC:     206.3  mm     G. Age:  25w 2d       57   %   FL/BPD:      83.8  71 - 87
 FL:        47  mm     G. Age:  25w 5d       67   %   FL/AC:       22.8  20 - 24
 HUM:     43.5  mm     G. Age:  25w 6d       73   %
 CER:       30  mm     G. Age:  26w 4d       85   %

 Est. FW:     785   gm    1 lb 12 oz     63  %
Gestational Age

 LMP:           30w 6d        Date:  11/21/13                 EDD:   08/28/14
 U/S Today:     24w 5d                                        EDD:   10/10/14
 Best:          24w 5d     Det. By:  U/S (06/25/14)           EDD:   10/10/14
Anatomy

 Cranium:          Appears normal         Aortic Arch:      Not well visualized
 Fetal Cavum:      Appears normal         Ductal Arch:      Appears normal
 Ventricles:       Appears normal         Diaphragm:        Appears normal
 Choroid Plexus:   Appears normal         Stomach:          Appears normal, left
                                                            sided
 Cerebellum:       Appears normal         Abdomen:          Appears normal
 Posterior Fossa:  Appears normal         Abdominal Wall:   Appears nml (cord
                                                            insert, abd wall)
 Nuchal Fold:      Not applicable (>20    Cord Vessels:     Appears normal (3
                   wks GA)                                  vessel cord)
 Face:             Appears normal         Kidneys:          Appear normal
                   (orbits and profile)
 Lips:             Appears normal         Bladder:          Appears normal
 Heart:            Appears normal         Spine:            Limited views
                   (4CH, axis, and                          appear normal
                   situs)
 RVOT:             Not well visualized    Lower             Appears normal
                                          Extremities:
 LVOT:             Not well visualized    Upper             Not well visualized
                                          Extremities:

 Other:  Fetus appears to be a male. Heels and Right 5th digit visualized.
         Technically difficult due to fetal position.
Targeted Anatomy

 Fetal Central Nervous System
 Lat. Ventricles:  3.5                    Cisterna Magna:
Cervix Uterus Adnexa

 Cervical Length:    2.6       cm

 Cervix:       Normal appearance by transabdominal scan.
 Left Ovary:    Size(cm) L: 3.36 x W: 1.81 x H: 1.66  Volume(cc):
 Right Ovary:   Size(cm) L: 2.67 x W: 1.81 x H: 1.44  Volume(cc):

 Adnexa:     No abnormality visualized.
Impression

 Single IUP at 24w 5d by ultrasound today
 Limited prenatal care, hx of previous term IUFD
 Normal fetal anatomic survey
 Limited views of the fetal heart obtained (outflow tracts, Aortic
 arch)
 The estimated fetal weight is at the 63rd %tile
 Normal amniotic fluid volume
Recommendations

 Recommend follow-up ultrasound examination in 4 weeks for
 growth and to complete anatomy.
 Recommend antenatal fetal testing beginning at 32 weeks
 gestation due to hx of previous term IUFD.

 questions or concerns.

## 2016-11-23 ENCOUNTER — Ambulatory Visit (INDEPENDENT_AMBULATORY_CARE_PROVIDER_SITE_OTHER): Payer: Medicaid Other | Admitting: *Deleted

## 2016-11-23 DIAGNOSIS — Z3201 Encounter for pregnancy test, result positive: Secondary | ICD-10-CM | POA: Diagnosis not present

## 2016-11-23 DIAGNOSIS — O0990 Supervision of high risk pregnancy, unspecified, unspecified trimester: Secondary | ICD-10-CM

## 2016-11-23 LAB — POCT PREGNANCY, URINE: Preg Test, Ur: POSITIVE — AB

## 2016-11-23 NOTE — Progress Notes (Signed)
Pt informed of +UPT today.  LMP in mid January - unsure of exact Kimberly [name].  Approximate EGA - 16w 2d, EDD 05/08/17.  Pt has Hx of hypertension in pregnancy and previous IUFD in 3rd trimester. US for anatomy scheduled 5/29 @ 1330.  She will schedule prenatal care in this office. Medication reconciliation completed.

## 2016-12-08 ENCOUNTER — Encounter (HOSPITAL_COMMUNITY): Payer: Self-pay | Admitting: *Deleted

## 2016-12-13 ENCOUNTER — Encounter (HOSPITAL_COMMUNITY): Payer: Self-pay

## 2016-12-13 ENCOUNTER — Other Ambulatory Visit (HOSPITAL_COMMUNITY): Payer: Self-pay | Admitting: *Deleted

## 2016-12-13 ENCOUNTER — Ambulatory Visit (HOSPITAL_COMMUNITY)
Admission: RE | Admit: 2016-12-13 | Discharge: 2016-12-13 | Disposition: A | Discharge status: Home / Self Care | Payer: Medicaid Other | Source: Ambulatory Visit | Attending: Family Medicine | Admitting: Family Medicine

## 2016-12-13 ENCOUNTER — Other Ambulatory Visit: Payer: Self-pay | Admitting: Family Medicine

## 2016-12-13 DIAGNOSIS — O99212 Obesity complicating pregnancy, second trimester: Secondary | ICD-10-CM | POA: Diagnosis not present

## 2016-12-13 DIAGNOSIS — Z3A19 19 weeks gestation of pregnancy: Secondary | ICD-10-CM | POA: Insufficient documentation

## 2016-12-13 DIAGNOSIS — O0932 Supervision of pregnancy with insufficient antenatal care, second trimester: Secondary | ICD-10-CM | POA: Insufficient documentation

## 2016-12-13 DIAGNOSIS — O34219 Maternal care for unspecified type scar from previous cesarean delivery: Secondary | ICD-10-CM

## 2016-12-13 DIAGNOSIS — Z3689 Encounter for other specified antenatal screening: Secondary | ICD-10-CM | POA: Diagnosis not present

## 2016-12-13 DIAGNOSIS — Z3492 Encounter for supervision of normal pregnancy, unspecified, second trimester: Secondary | ICD-10-CM

## 2016-12-13 DIAGNOSIS — O09299 Supervision of pregnancy with other poor reproductive or obstetric history, unspecified trimester: Secondary | ICD-10-CM

## 2016-12-13 DIAGNOSIS — Z8759 Personal history of other complications of pregnancy, childbirth and the puerperium: Secondary | ICD-10-CM | POA: Insufficient documentation

## 2016-12-13 DIAGNOSIS — O0990 Supervision of high risk pregnancy, unspecified, unspecified trimester: Secondary | ICD-10-CM

## 2016-12-13 DIAGNOSIS — O09292 Supervision of pregnancy with other poor reproductive or obstetric history, second trimester: Secondary | ICD-10-CM

## 2016-12-13 DIAGNOSIS — E669 Obesity, unspecified: Secondary | ICD-10-CM | POA: Diagnosis not present

## 2016-12-14 ENCOUNTER — Encounter: Payer: Medicaid Other | Admitting: Family Medicine

## 2016-12-14 ENCOUNTER — Encounter: Payer: Self-pay | Admitting: General Practice

## 2017-01-24 ENCOUNTER — Ambulatory Visit (HOSPITAL_COMMUNITY)
Admission: RE | Admit: 2017-01-24 | Discharge: 2017-01-24 | Disposition: A | Discharge status: Home / Self Care | Payer: Medicaid Other | Source: Ambulatory Visit | Attending: Family Medicine | Admitting: Family Medicine

## 2017-01-24 ENCOUNTER — Encounter (HOSPITAL_COMMUNITY): Payer: Self-pay

## 2017-05-06 ENCOUNTER — Inpatient Hospital Stay (HOSPITAL_COMMUNITY): Payer: Medicaid Other | Admitting: Anesthesiology

## 2017-05-06 ENCOUNTER — Encounter (HOSPITAL_COMMUNITY): Payer: Self-pay

## 2017-05-06 ENCOUNTER — Inpatient Hospital Stay (HOSPITAL_COMMUNITY)
Admission: AD | Admit: 2017-05-06 | Discharge: 2017-05-08 | DRG: 806 | Disposition: A | Discharge status: Home / Self Care | Payer: Medicaid Other | Source: Ambulatory Visit | Attending: Obstetrics and Gynecology | Admitting: Obstetrics and Gynecology

## 2017-05-06 DIAGNOSIS — O9902 Anemia complicating childbirth: Secondary | ICD-10-CM | POA: Diagnosis present

## 2017-05-06 DIAGNOSIS — D649 Anemia, unspecified: Secondary | ICD-10-CM | POA: Diagnosis present

## 2017-05-06 DIAGNOSIS — O99824 Streptococcus B carrier state complicating childbirth: Secondary | ICD-10-CM | POA: Diagnosis present

## 2017-05-06 DIAGNOSIS — O34211 Maternal care for low transverse scar from previous cesarean delivery: Secondary | ICD-10-CM | POA: Diagnosis present

## 2017-05-06 DIAGNOSIS — O99214 Obesity complicating childbirth: Secondary | ICD-10-CM | POA: Diagnosis present

## 2017-05-06 DIAGNOSIS — O134 Gestational [pregnancy-induced] hypertension without significant proteinuria, complicating childbirth: Principal | ICD-10-CM | POA: Diagnosis present

## 2017-05-06 DIAGNOSIS — Z3A39 39 weeks gestation of pregnancy: Secondary | ICD-10-CM

## 2017-05-06 DIAGNOSIS — Z6841 Body Mass Index (BMI) 40.0 and over, adult: Secondary | ICD-10-CM | POA: Diagnosis not present

## 2017-05-06 LAB — URINALYSIS, ROUTINE W REFLEX MICROSCOPIC
Bacteria, UA: NONE SEEN
Bilirubin Urine: NEGATIVE
GLUCOSE, UA: NEGATIVE mg/dL
KETONES UR: 5 mg/dL — AB
Leukocytes, UA: NEGATIVE
Nitrite: NEGATIVE
PH: 5 (ref 5.0–8.0)
PROTEIN: 30 mg/dL — AB
Specific Gravity, Urine: 1.026 (ref 1.005–1.030)

## 2017-05-06 LAB — RPR: RPR Ser Ql: NONREACTIVE

## 2017-05-06 LAB — COMPREHENSIVE METABOLIC PANEL
ALBUMIN: 3.3 g/dL — AB (ref 3.5–5.0)
ALK PHOS: 171 U/L — AB (ref 38–126)
ALT: 15 U/L (ref 14–54)
ANION GAP: 10 (ref 5–15)
AST: 20 U/L (ref 15–41)
BUN: 10 mg/dL (ref 6–20)
CHLORIDE: 104 mmol/L (ref 101–111)
CO2: 21 mmol/L — AB (ref 22–32)
Calcium: 9.3 mg/dL (ref 8.9–10.3)
Creatinine, Ser: 0.68 mg/dL (ref 0.44–1.00)
GFR calc non Af Amer: >60 mL/min (ref >60)
GLUCOSE: 79 mg/dL (ref 65–99)
POTASSIUM: 3.9 mmol/L (ref 3.5–5.1)
SODIUM: 135 mmol/L (ref 135–145)
Total Bilirubin: 0.4 mg/dL (ref 0.3–1.2)
Total Protein: 7.4 g/dL (ref 6.5–8.1)

## 2017-05-06 LAB — RAPID HIV SCREEN (HIV 1/2 AB+AG)
HIV 1/2 Antibodies: NONREACTIVE
HIV-1 P24 Antigen - HIV24: NONREACTIVE

## 2017-05-06 LAB — RAPID URINE DRUG SCREEN, HOSP PERFORMED
AMPHETAMINES: NOT DETECTED
Barbiturates: NOT DETECTED
Benzodiazepines: NOT DETECTED
COCAINE: NOT DETECTED
OPIATES: NOT DETECTED
TETRAHYDROCANNABINOL: NOT DETECTED

## 2017-05-06 LAB — CBC
HCT: 33.2 % — ABNORMAL LOW (ref 36.0–46.0)
HEMOGLOBIN: 11.2 g/dL — AB (ref 12.0–15.0)
MCH: 29.2 pg (ref 26.0–34.0)
MCHC: 33.7 g/dL (ref 30.0–36.0)
MCV: 86.7 fL (ref 78.0–100.0)
PLATELETS: 217 10*3/uL (ref 150–400)
RBC: 3.83 MIL/uL — ABNORMAL LOW (ref 3.87–5.11)
RDW: 13.3 % (ref 11.5–15.5)
WBC: 9.6 10*3/uL (ref 4.0–10.5)

## 2017-05-06 LAB — TYPE AND SCREEN
ABO/RH(D): B POS
ANTIBODY SCREEN: NEGATIVE

## 2017-05-06 LAB — WET PREP, GENITAL
CLUE CELLS WET PREP: NONE SEEN
Sperm: NONE SEEN
TRICH WET PREP: NONE SEEN
YEAST WET PREP: NONE SEEN

## 2017-05-06 LAB — PROTEIN / CREATININE RATIO, URINE
Creatinine, Urine: 283 mg/dL
PROTEIN CREATININE RATIO: 0.07 mg/mg{creat} (ref 0.00–0.15)
TOTAL PROTEIN, URINE: 21 mg/dL

## 2017-05-06 LAB — GROUP B STREP BY PCR: GROUP B STREP BY PCR: POSITIVE — AB

## 2017-05-06 LAB — HEPATITIS B SURFACE ANTIGEN: HEP B S AG: NEGATIVE

## 2017-05-06 LAB — OB RESULTS CONSOLE HIV ANTIBODY (ROUTINE TESTING): HIV: NONREACTIVE

## 2017-05-06 LAB — OB RESULTS CONSOLE GBS: GBS: POSITIVE

## 2017-05-06 MED ORDER — OXYTOCIN 40 UNITS IN LACTATED RINGERS INFUSION - SIMPLE MED
2.5000 [IU]/h | INTRAVENOUS | Status: DC
  Administered 2017-05-07: 2.5 [IU]/h via INTRAVENOUS

## 2017-05-06 MED ORDER — ACETAMINOPHEN 325 MG PO TABS: 650.0000 mg | ORAL_TABLET | ORAL | Status: DC | PRN

## 2017-05-06 MED ORDER — SOD CITRATE-CITRIC ACID 500-334 MG/5ML PO SOLN: 30.0000 mL | ORAL | Status: DC | PRN

## 2017-05-06 MED ORDER — OXYTOCIN BOLUS FROM INFUSION
500.0000 mL | Freq: Once | INTRAVENOUS | Status: AC
  Administered 2017-05-07: 500 mL via INTRAVENOUS

## 2017-05-06 MED ORDER — OXYTOCIN 40 UNITS IN LACTATED RINGERS INFUSION - SIMPLE MED
1.0000 m[IU]/min | INTRAVENOUS | Status: DC
  Administered 2017-05-06: 1 m[IU]/min via INTRAVENOUS
  Filled 2017-05-06: qty 1000

## 2017-05-06 MED ORDER — ONDANSETRON HCL 4 MG/2ML IJ SOLN: 4.0000 mg | Freq: Four times a day (QID) | INTRAMUSCULAR | Status: DC | PRN

## 2017-05-06 MED ORDER — PENICILLIN G POTASSIUM 5000000 UNITS IJ SOLR
5.0000 10*6.[IU] | Freq: Once | INTRAVENOUS | Status: AC
  Administered 2017-05-06: 5 10*6.[IU] via INTRAVENOUS
  Filled 2017-05-06: qty 5

## 2017-05-06 MED ORDER — TERBUTALINE SULFATE 1 MG/ML IJ SOLN
0.2500 mg | Freq: Once | INTRAMUSCULAR | Status: DC | PRN
  Filled 2017-05-06: qty 1

## 2017-05-06 MED ORDER — DIPHENHYDRAMINE HCL 50 MG/ML IJ SOLN: 12.5000 mg | INTRAMUSCULAR | Status: DC | PRN

## 2017-05-06 MED ORDER — LACTATED RINGERS IV SOLN
500.0000 mL | Freq: Once | INTRAVENOUS | Status: AC
  Administered 2017-05-06: 500 mL via INTRAVENOUS

## 2017-05-06 MED ORDER — EPHEDRINE 5 MG/ML INJ
10.0000 mg | INTRAVENOUS | Status: DC | PRN
  Filled 2017-05-06: qty 2

## 2017-05-06 MED ORDER — LACTATED RINGERS IV SOLN: 500.0000 mL | Freq: Once | INTRAVENOUS | Status: AC

## 2017-05-06 MED ORDER — LACTATED RINGERS IV SOLN
INTRAVENOUS | Status: DC
  Administered 2017-05-06 (×3): via INTRAVENOUS

## 2017-05-06 MED ORDER — FENTANYL CITRATE (PF) 100 MCG/2ML IJ SOLN: 50.0000 ug | INTRAMUSCULAR | Status: DC | PRN

## 2017-05-06 MED ORDER — LIDOCAINE HCL (PF) 1 % IJ SOLN
30.0000 mL | INTRAMUSCULAR | Status: DC | PRN
  Filled 2017-05-06: qty 30

## 2017-05-06 MED ORDER — FENTANYL 2.5 MCG/ML BUPIVACAINE 1/10 % EPIDURAL INFUSION (WH - ANES)
14.0000 mL/h | INTRAMUSCULAR | Status: DC | PRN
  Administered 2017-05-06 (×3): 14 mL/h via EPIDURAL
  Filled 2017-05-06 (×2): qty 100

## 2017-05-06 MED ORDER — PHENYLEPHRINE 40 MCG/ML (10ML) SYRINGE FOR IV PUSH (FOR BLOOD PRESSURE SUPPORT)
PREFILLED_SYRINGE | INTRAVENOUS | Status: AC
  Filled 2017-05-06: qty 10

## 2017-05-06 MED ORDER — OXYCODONE-ACETAMINOPHEN 5-325 MG PO TABS: 2.0000 | ORAL_TABLET | ORAL | Status: DC | PRN

## 2017-05-06 MED ORDER — LACTATED RINGERS IV SOLN: 500.0000 mL | INTRAVENOUS | Status: DC | PRN

## 2017-05-06 MED ORDER — PHENYLEPHRINE 40 MCG/ML (10ML) SYRINGE FOR IV PUSH (FOR BLOOD PRESSURE SUPPORT)
80.0000 ug | PREFILLED_SYRINGE | INTRAVENOUS | Status: DC | PRN
  Filled 2017-05-06: qty 5

## 2017-05-06 MED ORDER — PENICILLIN G POT IN DEXTROSE 60000 UNIT/ML IV SOLN
3.0000 10*6.[IU] | INTRAVENOUS | Status: DC
  Administered 2017-05-06 (×4): 3 10*6.[IU] via INTRAVENOUS
  Filled 2017-05-06 (×8): qty 50

## 2017-05-06 MED ORDER — FENTANYL 2.5 MCG/ML BUPIVACAINE 1/10 % EPIDURAL INFUSION (WH - ANES)
INTRAMUSCULAR | Status: AC
  Filled 2017-05-06: qty 100

## 2017-05-06 MED ORDER — LIDOCAINE HCL (PF) 1 % IJ SOLN
INTRAMUSCULAR | Status: DC | PRN
  Administered 2017-05-06: 3 mL
  Administered 2017-05-06: 5 mL
  Administered 2017-05-06: 2 mL

## 2017-05-06 MED ORDER — OXYCODONE-ACETAMINOPHEN 5-325 MG PO TABS: 1.0000 | ORAL_TABLET | ORAL | Status: DC | PRN

## 2017-05-06 NOTE — Anesthesia Procedure Notes (Signed)
Epidural Patient location during procedure: OB Start time: 05/06/2017 7:30 AM End time: 05/06/2017 7:40 AM  Staffing Anesthesiologist: Marcene DuosFITZGERALD, Milissa Fesperman Performed: anesthesiologist   Preanesthetic Checklist Completed: patient identified, site marked, surgical consent, pre-op evaluation, timeout performed, IV checked, risks and benefits discussed and monitors and equipment checked  Epidural Patient position: sitting Prep: site prepped and draped and DuraPrep Patient monitoring: continuous pulse ox and blood pressure Approach: midline Location: L3-L4 Injection technique: LOR air  Needle:  Needle type: Tuohy  Needle gauge: 17 G Needle length: 9 cm and 9 Needle insertion depth: 9 cm Catheter type: closed end flexible Catheter size: 19 Gauge Catheter at skin depth: 16.5 cm Test dose: negative  Assessment Events: blood not aspirated, injection not painful, no injection resistance, negative IV test and no paresthesia

## 2017-05-06 NOTE — MAU Note (Signed)
Little bit of contractions after walking at 11 pm.  Got stronger at 2:45 am with pressure when standing. No bleeding. No leaking. No mucus discharge. Baby moving well. Every since losing a child in 2014, doesn't want to go to prenatal visits with other pregnancies because went to all appointments then but lost a child.  [facility] and had an ultrasound few months ago.

## 2017-05-06 NOTE — Progress Notes (Signed)
Labor Progress Note Kimberly Matelexis S [name] is a 26 y.o. [id number] at 752w5d presented for IOL  gHTN and hx of IUFD. S: Pt reports feeling fine, continues with pressure. Denies headaches, blurry vision or RUQ.  O:  BP 137/69   Pulse 90   Temp (!) 97.4 F (36.3 C) (Oral)   Resp 16   Ht 5\' 3"  (1.6 m)   Wt 242 lb (109.8 kg)   LMP  (LMP Unknown)   SpO2 99%   BMI 42.87 kg/m  EFM: 140/mod vari/+ accels  CVE: Dilation: 7 Effacement (%): 70 Cervical Position: Middle Station: -2 Presentation: Vertex Exam by:: J.Follmer,RNC   A&P: 26 y.o. [id number] 672w5d here for IOL due to gHTN and hx of IUFD. #Labor: Progressing well. Continues with pit. #Pain:  Well controlled at 5/10 with epidural #FWB: cat 1 #GBS positive (PCN) #gHTN (BP controlled at 137/69). Continue to monitor   Kimberly BroadKeriann S Jaivyn Gulla, MD 4:21 PM

## 2017-05-06 NOTE — H&P (Signed)
LABOR AND DELIVERY ADMISSION HISTORY AND PHYSICAL NOTE  Kimberly [name] is a 26 y.o. female [phone] with IUP at 4663w5d by LMP, and hx significant for gestational HTN, and IUFD (placental abruption), and no prenatal care this pregnancy, presenting for increased pressure in her lower abdomen and groin area with some increase in contraction intensity.  She reports positive fetal movement. She denies leakage of fluid or vaginal bleeding.  She states that she only has high blood pressure when she is pregnant and that she has had gHTN in her previous pregnancies.  She denies fever, chills, recent sickness, headache, vision changes or blurry vision, SOB, abdominal pain, nausea or vomiting, urinary pain or burning.  She does endorse episodes of diarrhea that started tonight.  Prenatal History/Complications: Prenatal care: only had nurse's visit for pregnancy confirmation and U/S done this pregnancy Pregnancy complications:  - gHTN - IUFD at 38 weeks, placenta abruption - Hx of C/S: s/p VBAC x 2  Past Medical History: Past Medical History:  Diagnosis Date  . Anxiety    because of last preg,scared something is wrong  . Chlamydia   . Constipation   . Depression    after loss of preg/child  . Gonorrhea   . Hypertension   . Pregnancy induced hypertension     Past Surgical History: Past Surgical History:  Procedure Laterality Date  . CESAREAN SECTION N/A 03/20/2013   Procedure: CESAREAN SECTION;  Surgeon: Kathreen CosierBernard A Marshall, MD;  Location: [facility];  Service: Obstetrics;  Laterality: N/A;    Obstetrical History: OB History    Gravida Para Term Preterm AB Living   5 4 4  0 0 3   SAB TAB Ectopic Multiple Live Births   0 0 0 0 3      Social History: Social History   Social History  . Marital status: Single    Spouse name: N/A  . Number of children: N/A  . Years of education: N/A   Social History Main Topics  . Smoking status: Never Smoker  . Smokeless tobacco: Never Used  .  Alcohol use No  . Drug use: No  . Sexual activity: Yes    Birth control/ protection: None   Other Topics Concern  . None   Social History Narrative  . None    Family History: Family History  Problem Relation Age of Onset  . Asthma Son   . Cancer Neg Hx   . Diabetes Neg Hx   . Heart disease Neg Hx   . Hypertension Neg Hx   . Stroke Neg Hx   . Hearing loss Neg Hx     Allergies: No Known Allergies  Prescriptions Prior to Admission  Medication Sig Dispense Refill Last Dose  . Prenatal Vit-Fe Fumarate-FA (PRENATAL MULTIVITAMIN) TABS tablet Take 1 tablet by mouth daily at 12 noon.   Past Month at Unknown time     Review of Systems  All systems reviewed and negative except as stated in HPI  Physical Exam Blood pressure 130/77, pulse 89, temperature 98.1 F (36.7 C), temperature source Oral, resp. rate 18, SpO2 99 %, unknown if currently breastfeeding. General appearance: alert, cooperative and no distress Lungs: clear to auscultation bilaterally Heart: regular rate and rhythm Abdomen: soft, non-tender; bowel sounds normal Extremities: No calf swelling or tenderness Presentation: cephalic Fetal monitoring: 130bpm, moderate variability, accelerations present, decelerations absent Uterine activity: irregular, every 2-6 minutes Dilation: 1.5 Effacement (%): 50 Station: -3 Exam by:: benji stanleyRN  Prenatal labs: ABO, Rh:  B  pos Antibody:  negative Rubella:  pending RPR:   pending HBsAg:   pending HIV:   non-reactive GC/Chlamydia: pending GBS:   unknown 1 hr Glucola: pending Genetic screening:  Not performed Anatomy US: normal  Prenatal Transfer Tool  Maternal Diabetes: n/a, no prenatal care Genetic Screening: no prenatal care Maternal Ultrasounds/Referrals: Normal Fetal Ultrasounds or other Referrals:  None Maternal Substance Abuse:  declines Significant Maternal Medications:  None Significant Maternal Lab Results: None  Results for orders placed or  performed during the hospital encounter of 05/06/17 (from the past 24 hour(s))  CBC   Collection Time: 05/06/17  4:13 AM  Result Value Ref Range   WBC 9.6 4.0 - 10.5 K/uL   RBC 3.83 (L) 3.87 - 5.11 MIL/uL   Hemoglobin 11.2 (L) 12.0 - 15.0 g/dL   HCT 78.2 (L) 95.6 - 21.3 %   MCV 86.7 78.0 - 100.0 fL   MCH 29.2 26.0 - 34.0 pg   MCHC 33.7 30.0 - 36.0 g/dL   RDW 08.6 57.8 - 46.9 %   Platelets 217 150 - 400 K/uL  Comprehensive metabolic panel   Collection Time: 05/06/17  4:13 AM  Result Value Ref Range   Sodium 135 135 - 145 mmol/L   Potassium 3.9 3.5 - 5.1 mmol/L   Chloride 104 101 - 111 mmol/L   CO2 21 (L) 22 - 32 mmol/L   Glucose, Bld 79 65 - 99 mg/dL   BUN 10 6 - 20 mg/dL   Creatinine, Ser 6.29 0.44 - 1.00 mg/dL   Calcium 9.3 8.9 - 52.8 mg/dL   Total Protein 7.4 6.5 - 8.1 g/dL   Albumin 3.3 (L) 3.5 - 5.0 g/dL   AST 20 15 - 41 U/L   ALT 15 14 - 54 U/L   Alkaline Phosphatase 171 (H) 38 - 126 U/L   Total Bilirubin 0.4 0.3 - 1.2 mg/dL   GFR calc non Af Amer >60 >60 mL/min   GFR calc Af Amer >60 >60 mL/min   Anion gap 10 5 - 15  Rapid HIV screen (HIV 1/2 Ab+Ag) ([facility] Only)   Collection Time: 05/06/17  4:13 AM  Result Value Ref Range   HIV-1 P24 Antigen - HIV24 NON REACTIVE NON REACTIVE   HIV 1/2 Antibodies NON REACTIVE NON REACTIVE   Interpretation (HIV Ag Ab)      A non reactive test result means that HIV 1 or HIV 2 antibodies and HIV 1 p24 antigen were not detected in the specimen.  Type and screen [facility] [place]   Collection Time: 05/06/17  4:13 AM  Result Value Ref Range   ABO/RH(D) B POS    Antibody Screen NEG    Sample Expiration 05/09/2017   Wet prep, genital   Collection Time: 05/06/17  4:15 AM  Result Value Ref Range   Yeast Wet Prep HPF POC NONE SEEN NONE SEEN   Trich, Wet Prep NONE SEEN NONE SEEN   Clue Cells Wet Prep HPF POC NONE SEEN NONE SEEN   WBC, Wet Prep HPF POC FEW (A) NONE SEEN   Sperm NONE SEEN   Urinalysis, Routine w reflex  microscopic   Collection Time: 05/06/17  4:34 AM  Result Value Ref Range   Color, Urine YELLOW YELLOW   APPearance HAZY (A) CLEAR   Specific Gravity, Urine 1.026 1.005 - 1.030   pH 5.0 5.0 - 8.0   Glucose, UA NEGATIVE NEGATIVE mg/dL   Hgb urine dipstick MODERATE (A) NEGATIVE   Bilirubin Urine  NEGATIVE NEGATIVE   Ketones, ur 5 (A) NEGATIVE mg/dL   Protein, ur 30 (A) NEGATIVE mg/dL   Nitrite NEGATIVE NEGATIVE   Leukocytes, UA NEGATIVE NEGATIVE   RBC / HPF TOO NUMEROUS TO COUNT 0 - 5 RBC/hpf   WBC, UA 6-30 0 - 5 WBC/hpf   Bacteria, UA NONE SEEN NONE SEEN   Squamous Epithelial / LPF 0-5 (A) NONE SEEN   Mucus PRESENT   Protein / creatinine ratio, urine   Collection Time: 05/06/17  4:34 AM  Result Value Ref Range   Creatinine, Urine 283.00 mg/dL   Total Protein, Urine 21 mg/dL   Protein Creatinine Ratio 0.07 0.00 - 0.15 mg/mg[Cre]    Patient Active Problem List   Diagnosis Date Noted  . Hypertension affecting pregnancy in third trimester 09/20/2014  . Supervision of high risk pregnancy in third trimester 09/15/2014  . Hx of intrauterine fetal death, currently pregnant   . Unspecified hypertension, with delivery, with current postpartum complication 03/27/2013  . Fetal demise > 22 weeks, delivered, current hospitalization 03/21/2013    Assessment: Kimberly [name] is a 26 y.o. [id number] at [redacted]w[redacted]d with obstetric hx significant for hx of IUFD who presented to MAU for labor eval. Not in active labor, but will admit for IOL d/t hx of IUFD and currently >39 week, as well as elevated BPs (gHTN vs cHTN).   #Labor: Will place FB and start Pitocin since she is contracting  #Pain: Planning on getting epidural #FWB: Cat I #ID:  GBS PCR positive, start PCN #MOF: bottle #MOC: POP vs Nexplanon #Circ:  Boy/outpatient  #No PNC: prenatal labs drawn. Check UDS as well  #Elevated BP: unsure if she has CHTN, has had GHTN with other pregnancies. PIH labs wnl, UPC 0.07. Monitor  #TOLAC: s/p VBAC  x2. Will sign TOLAC consent  Arlyce Harman 05/06/2017, 4:41 AM   OB FELLOW HISTORY AND PHYSICAL ATTESTATION  I have seen and examined this patient; I agree with above documentation in the resident's note.  I have modified note as appropriate.  Frederik Pear, MD OB Fellow 05/06/2017, 6:42 AM

## 2017-05-06 NOTE — Progress Notes (Signed)
Labor Progress Note Jonette Matelexis S Poncedeleon is a 26 y.o. [id number] at 463w5d presented for IOL gHTN and hx of IUFD. S: Pt states that she continues to feel pressure in lower abdomen. Denies headaches, blurry vision or RUQ pain. Right leg continues for feel heavy (post epidural).  O:  BP (!) 145/84   Pulse 87   Temp (!) 97.4 F (36.3 C) (Oral)   Resp 16   Ht 5\' 3"  (1.6 m)   Wt 242 lb (109.8 kg)   LMP  (LMP Unknown)   SpO2 99%   BMI 42.87 kg/m  EFM: 140/mod vari/ +accels  CVE: Dilation: 6 Effacement (%): 80 Cervical Position: Middle Station: -2 Presentation: Vertex Exam by:: Fabian NovemberM. Bhambri CNM/ Dr. Verta EllenMinott   A&P: 26 y.o. [id number] 223w5d here for IOL due to gHTN and hx of IUFD. #Labor: Progressing well. Continue pit #Pain: well controlled with epidural #FWB: cat 1 #GBS positive (PCN) #gHTN (BP controlled at 137/69). Continue to monitor   Suella BroadKeriann S Onyx Edgley, MD 5:49 PM

## 2017-05-06 NOTE — Anesthesia Preprocedure Evaluation (Signed)
Anesthesia Evaluation  Patient identified by MRN, date of birth, ID band Patient awake    Reviewed: Allergy & Precautions, Patient's Chart, lab work & pertinent test results  Airway Mallampati: III  TM Distance: >3 FB     Dental   Pulmonary neg pulmonary ROS,    Pulmonary exam normal        Cardiovascular hypertension, Normal cardiovascular exam     Neuro/Psych Anxiety Depression negative neurological ROS     GI/Hepatic negative GI ROS, Neg liver ROS,   Endo/Other  Morbid obesity  Renal/GU negative Renal ROS     Musculoskeletal   Abdominal   Peds  Hematology  (+) anemia ,   Anesthesia Other Findings   Reproductive/Obstetrics (+) Pregnancy Hx csec. S/p VBAC x2. No prenatal care this pregnancy                             Lab Results  Component Value Date   WBC 9.6 05/06/2017   HGB 11.2 (L) 05/06/2017   HCT 33.2 (L) 05/06/2017   MCV 86.7 05/06/2017   PLT 217 05/06/2017    Anesthesia Physical Anesthesia Plan  ASA: III  Anesthesia Plan: Epidural   Post-op Pain Management:    Induction:   PONV Risk Score and Plan: Treatment may vary due to age or medical condition  Airway Management Planned: Natural Airway  Additional Equipment:   Intra-op Plan:   Post-operative Plan:   Informed Consent: I have reviewed the patients History and Physical, chart, labs and discussed the procedure including the risks, benefits and alternatives for the proposed anesthesia with the patient or authorized representative who has indicated his/her understanding and acceptance.     Plan Discussed with:   Anesthesia Plan Comments:         Anesthesia Quick Evaluation

## 2017-05-06 NOTE — Anesthesia Pain Management Evaluation Note (Signed)
  [facility] Pain Management Visit Note  Patient: Kimberly [name], 26 y.o., female  "Hello I am a member of the anesthesia team at [facility]. We have an anesthesia team available at all times to provide care throughout the hospital, including epidural management and anesthesia for C-section. I don't know your plan for the delivery whether it a natural birth, water birth, IV sedation, nitrous supplementation, doula or epidural, but we want to meet your pain goals."   1.Was your pain managed to your expectations on prior hospitalizations?   Yes   2.What is your expectation for pain management during this hospitalization?     Epidural  3.How can we help you reach that goal? Support prn  Record the patient's initial score and the patient's pain goal.   Pain: 0  Pain Goal: 6 [facility] wants you to be able to say your pain was always managed very well.  [facility],Kimberly [name] 05/06/2017

## 2017-05-06 NOTE — Progress Notes (Signed)
LABOR PROGRESS NOTE  Kimberly [name] is a 26 y.o. [id number] at 652w5d  admitted for IOL d/t h/o IUFD and elevated Bps  Subjective: Doing well, comfortable with epidural  Objective: BP 140/69   Pulse (!) 112   Temp 99.2 F (37.3 C) (Oral)   Resp 16   Ht 5\' 3"  (1.6 m)   Wt 242 lb (109.8 kg)   LMP  (LMP Unknown)   SpO2 99%   BMI 42.87 kg/m  or  Vitals:   05/06/17 2231 05/06/17 2301 05/06/17 2313 05/06/17 2332  BP: 140/73 (!) 142/73  140/69  Pulse: (!) 103 96  (!) 112  Resp:      Temp:   99.2 F (37.3 C)   TempSrc:   Oral   SpO2:      Weight:      Height:        SVE: 9/100/-1 FHT: baseline rate 130, moderate varibility, + acel, no decel Toco: ctx q 2-3 min  Assessment / Plan: 26 y.o. [id number] at 512w5d here for IOL for  h/o IUFD and gHTN   Labor: AROM, clear fluid. Continue IV Pit Fetal Wellbeing:  Cat I Pain Control:  epdidural Anticipated MOD:  SVD  Frederik PearJulie P Tujuana Kilmartin, MD 05/06/2017, 11:46 PM

## 2017-05-06 NOTE — Progress Notes (Addendum)
Labor Progress Note Kimberly [name] is a 26 y.o. [id number] at 3068w5d presented for gHTN and hx of IUFD. S: Feeling pressure, comfortable with epidural. Denies headaches, blurry vision or RUQ pain.  O:  BP 132/74   Pulse 81   Temp 98.7 F (37.1 C) (Oral)   Resp 18   Ht 5\' 3"  (1.6 m)   Wt 242 lb (109.8 kg)   LMP  (LMP Unknown)   SpO2 99%   BMI 42.87 kg/m  EFM: 130/mod vari/ +accels  CVE: Dilation: 6 Effacement (%): 60 Cervical Position: Middle Station: -2 Presentation: Vertex Exam by:: M. B CNM    A&P: 26 y.o. [id number] 7968w5d gHTN and hx of IUFD. #Labor: Progressing well.  Foley bulb out and Pit continues.  #Pain: well controlled with epidural #FWB: cat 1 #GBS positive (PCN) #gHTN (BP controlled at 109-154 systolic/ 58-90 diastolic with last value at 132/74). Continue to monitor   Suella BroadKeriann S Minott, MD 11:17 AM

## 2017-05-07 ENCOUNTER — Encounter (HOSPITAL_COMMUNITY): Payer: Self-pay

## 2017-05-07 DIAGNOSIS — Z3A39 39 weeks gestation of pregnancy: Secondary | ICD-10-CM

## 2017-05-07 DIAGNOSIS — O34211 Maternal care for low transverse scar from previous cesarean delivery: Secondary | ICD-10-CM

## 2017-05-07 DIAGNOSIS — O134 Gestational [pregnancy-induced] hypertension without significant proteinuria, complicating childbirth: Secondary | ICD-10-CM

## 2017-05-07 DIAGNOSIS — O99824 Streptococcus B carrier state complicating childbirth: Secondary | ICD-10-CM

## 2017-05-07 LAB — RUBELLA SCREEN: Rubella: 1.05 index (ref >0.99)

## 2017-05-07 MED ORDER — IBUPROFEN 600 MG PO TABS
600.0000 mg | ORAL_TABLET | Freq: Four times a day (QID) | ORAL | Status: DC
  Administered 2017-05-07 – 2017-05-08 (×5): 600 mg via ORAL
  Filled 2017-05-07 (×6): qty 1

## 2017-05-07 MED ORDER — WITCH HAZEL-GLYCERIN EX PADS: 1.0000 "application " | MEDICATED_PAD | CUTANEOUS | Status: DC | PRN

## 2017-05-07 MED ORDER — DIPHENHYDRAMINE HCL 25 MG PO CAPS: 25.0000 mg | ORAL_CAPSULE | Freq: Four times a day (QID) | ORAL | Status: DC | PRN

## 2017-05-07 MED ORDER — TETANUS-DIPHTH-ACELL PERTUSSIS 5-2.5-18.5 LF-MCG/0.5 IM SUSP: 0.5000 mL | Freq: Once | INTRAMUSCULAR | Status: DC

## 2017-05-07 MED ORDER — COCONUT OIL OIL: 1.0000 "application " | TOPICAL_OIL | Status: DC | PRN

## 2017-05-07 MED ORDER — SENNOSIDES-DOCUSATE SODIUM 8.6-50 MG PO TABS
2.0000 | ORAL_TABLET | ORAL | Status: DC
  Administered 2017-05-08: 2 via ORAL
  Filled 2017-05-07: qty 2

## 2017-05-07 MED ORDER — ONDANSETRON HCL 4 MG/2ML IJ SOLN: 4.0000 mg | INTRAMUSCULAR | Status: DC | PRN

## 2017-05-07 MED ORDER — ONDANSETRON HCL 4 MG PO TABS: 4.0000 mg | ORAL_TABLET | ORAL | Status: DC | PRN

## 2017-05-07 MED ORDER — DIBUCAINE 1 % RE OINT: 1.0000 "application " | TOPICAL_OINTMENT | RECTAL | Status: DC | PRN

## 2017-05-07 MED ORDER — OXYCODONE HCL 5 MG PO TABS
10.0000 mg | ORAL_TABLET | Freq: Once | ORAL | Status: AC
  Administered 2017-05-07: 10 mg via ORAL
  Filled 2017-05-07: qty 2

## 2017-05-07 MED ORDER — ZOLPIDEM TARTRATE 5 MG PO TABS: 5.0000 mg | ORAL_TABLET | Freq: Every evening | ORAL | Status: DC | PRN

## 2017-05-07 MED ORDER — PRENATAL MULTIVITAMIN CH
1.0000 | ORAL_TABLET | Freq: Every day | ORAL | Status: DC
  Administered 2017-05-07: 1 via ORAL
  Filled 2017-05-07 (×2): qty 1

## 2017-05-07 MED ORDER — ACETAMINOPHEN 325 MG PO TABS
650.0000 mg | ORAL_TABLET | ORAL | Status: DC | PRN
  Administered 2017-05-07 – 2017-05-08 (×2): 650 mg via ORAL
  Filled 2017-05-07 (×2): qty 2

## 2017-05-07 MED ORDER — BENZOCAINE-MENTHOL 20-0.5 % EX AERO
1.0000 "application " | INHALATION_SPRAY | CUTANEOUS | Status: DC | PRN
  Administered 2017-05-07: 1 via TOPICAL
  Filled 2017-05-07: qty 56

## 2017-05-07 MED ORDER — SIMETHICONE 80 MG PO CHEW: 80.0000 mg | CHEWABLE_TABLET | ORAL | Status: DC | PRN

## 2017-05-07 NOTE — Anesthesia Postprocedure Evaluation (Signed)
Anesthesia Post Note  Patient: Kimberly [name]  Procedure(s) Performed: AN AD HOC LABOR EPIDURAL     Patient location during evaluation: Mother Baby Anesthesia Type: Epidural Level of consciousness: awake Pain management: satisfactory to patient Vital Signs Assessment: post-procedure vital signs reviewed and stable Respiratory status: spontaneous breathing Cardiovascular status: stable Anesthetic complications: no    Last Vitals:  Vitals:   05/07/17 0353 05/07/17 0800  BP: (!) 148/72 134/70  Pulse: 95 85  Resp: 18 18  Temp: 37.1 C 36.9 C  SpO2: 100% 99%    Last Pain:  Vitals:   05/07/17 1156  TempSrc:   PainSc: 0-No pain   Pain Goal: Patients Stated Pain Goal: 4 (05/06/17 0513)               Cephus ShellingBURGER,Baeleigh Devincent

## 2017-05-08 ENCOUNTER — Encounter (HOSPITAL_COMMUNITY): Payer: Self-pay | Admitting: *Deleted

## 2017-05-08 LAB — GC/CHLAMYDIA PROBE AMP (~~LOC~~) NOT AT ARMC
Chlamydia: NEGATIVE
Neisseria Gonorrhea: NEGATIVE

## 2017-05-08 MED ORDER — IBUPROFEN 600 MG PO TABS: 600.0000 mg | ORAL_TABLET | Freq: Four times a day (QID) | ORAL | 1 refills | Status: DC | PRN

## 2017-05-08 MED ORDER — SENNOSIDES-DOCUSATE SODIUM 8.6-50 MG PO TABS: 2.0000 | ORAL_TABLET | Freq: Every evening | ORAL | 0 refills | Status: AC | PRN

## 2017-05-08 NOTE — Discharge Summary (Signed)
OB Discharge Summary     Patient Name: Kimberly [name] DOB: 1991-04-08 MRN: [id number]  Date of admission: 05/06/2017 Delivering MD: Frederik PearEGELE, Veda Arrellano P   Date of discharge: 05/08/2017  Admitting diagnosis: 39 wks ctx Intrauterine pregnancy: 4027w6d     Secondary diagnosis:  Principal Problem:   SVD (spontaneous vaginal delivery) Active Problems:   Normal labor  Additional problems: Hx of fetal demise, gHTN, limited [facility]     Discharge diagnosis: Term Pregnancy Delivered and Gestational Hypertension                                                                              Post partum procedures:none  Augmentation: Pitocin  Complications: None  Hospital course:  Induction of Labor With Vaginal Delivery   26 y.o. yo [id number] at 3027w6d was admitted to the hospital 05/06/2017 for induction of labor.  Indication for induction: Gestational hypertension.  Patient had an uncomplicated labor course as follows: Membrane Rupture Time/Date: 12:10 AM ,05/07/2017   Intrapartum Procedures: Episiotomy: None [1]                                         Lacerations:  None [1]  Patient had delivery of a Viable infant.  Information for the patient's newborn:  Doristine SectionSnipes, Boy Nelta [[id number]][[id number]]      05/07/2017  Details of delivery can be found in separate delivery note.  Patient had a routine postpartum course. Blood pressures were noted to be mildly elevated postpartum, and Baby Love Nurs'se visit was set up for follow up. Patient is discharged home 05/08/17.  Physical exam  Vitals:   05/07/17 0800 05/07/17 1520 05/07/17 1900 05/08/17 0553  BP: 134/70 (!) 141/67 131/88 (!) 131/96  Pulse: 85 85 77 68  Resp: 18 18 20 20   Temp: 98.4 F (36.9 C) 98.7 F (37.1 C) (!) 97.5 F (36.4 C) 97.8 F (36.6 C)  TempSrc: Oral Oral Oral Oral  SpO2: 99% 99% 99%   Weight:      Height:       General: alert, cooperative and no distress Lochia: appropriate Uterine Fundus: firm Incision: N/A DVT Evaluation:  No evidence of DVT seen on physical exam. No cords or calf tenderness. Labs: Lab Results  Component Value Date   WBC 9.6 05/06/2017   HGB 11.2 (L) 05/06/2017   HCT 33.2 (L) 05/06/2017   MCV 86.7 05/06/2017   PLT 217 05/06/2017   CMP Latest Ref Rng & Units 05/06/2017  Glucose 65 - 99 mg/dL 79  BUN 6 - 20 mg/dL 10  Creatinine 3.470.44 - 4.251.00 mg/dL 9.560.68  Sodium 387135 - 564145 mmol/L 135  Potassium 3.5 - 5.1 mmol/L 3.9  Chloride 101 - 111 mmol/L 104  CO2 22 - 32 mmol/L 21(L)  Calcium 8.9 - 10.3 mg/dL 9.3  Total Protein 6.5 - 8.1 g/dL 7.4  Total Bilirubin 0.3 - 1.2 mg/dL 0.4  Alkaline Phos 38 - 126 U/L 171(H)  AST 15 - 41 U/L 20  ALT 14 - 54 U/L 15    Discharge instruction: per After Visit Summary and "Baby and Me Booklet".  After visit meds:  Allergies as of 05/08/2017   No Known Allergies     Medication List    TAKE these medications   ibuprofen 600 MG tablet Commonly known as:  ADVIL,MOTRIN Take 1 tablet (600 mg total) by mouth every 6 (six) hours as needed.   senna-docusate 8.6-50 MG tablet Commonly known as:  Senokot-S Take 2 tablets by mouth at bedtime as needed for mild constipation.       Diet: routine diet  Activity: Advance as tolerated. Pelvic rest for 6 weeks.   Outpatient follow up: 1 week for BP check  Message sent to [facility] admin pool for follow up appt   Postpartum contraception: Undecided  Newborn Data: Live born female  Birth Weight: 7 lb 13.4 oz (3555 g) APGAR: 8, 9  Newborn Delivery   Birth date/time:  05/07/2017 00:50:00 Delivery type:        Baby Feeding: Breast Disposition:home with mother   05/08/2017 Arlyce Harman, DO   OB FELLOW DISCHARGE ATTESTATION  I have seen and examined this patient and agree with above documentation in the resident's note.   Frederik Pear, MD OB Fellow

## 2017-05-08 NOTE — Clinical Social Work Maternal (Signed)
CLINICAL SOCIAL WORK MATERNAL/CHILD NOTE  Patient Details  Name: Kimberly [name] MRN: [id number] Date of Birth: 06/20/1991  Date:  05/08/2017  Clinical Social Worker Initiating Note:  Felicita Nuncio, LCSW Date/Time: Initiated:  05/08/17/1230     Child's Name:  Kimberly [name]   Biological Parents:  Mother, Father   Need for Interpreter:  None   Reason for Referral:  Behavioral Health Concerns, Late or No Prenatal Care    Address:  [address] [place] [address]    Phone number:  [phone] (home)     Additional phone number:  Household Members/Support Persons (HM/SP):   Household Member/Support Person 1, Household Member/Support Person 2, Household Member/Support Person 3, Household Member/Support Person 4   HM/SP Name Relationship DOB or Age  HM/SP -1   FOB    HM/SP -2   son 8  HM/SP -3 Kingston Sessums son 09/21/14  HM/SP -4 Hylan Vanzanten daughter 02/26/16  HM/SP -5        HM/SP -6        HM/SP -7        HM/SP -8          Natural Supports (not living in the home):  Immediate Family, Extended Family, Friends   Professional Supports: None   Employment:     Type of Work:     Education:      Homebound arranged:    Financial Resources:  Medicaid   Other Resources:  WIC   Cultural/Religious Considerations Which May Impact Care: None stated.  [name]'s facesheet notes religion as Christian.  Strengths:  Ability to meet basic needs , Home prepared for child , Pediatrician chosen   Psychotropic Medications:         Pediatrician:    [place] area  Pediatrician List:   [place] ([address])  [place]    [place]    [place]    [place]    [place]      Pediatrician Fax Number: [phone]  Risk Factors/Current Problems:  None   Cognitive State:  Able to Concentrate , Alert , Linear Thinking , Insightful    Mood/Affect:  Calm , Comfortable , Interested    CSW  Assessment: CSW met with [name] in her first floor room/127 to offer support and complete assessment due to hx of Anx/Dep and NPNC.  CSW reviewed record and notes documentation and Anxiety and Depression following IUFD at 38 weeks in 2014.   [name] was pleasant and welcoming.  Baby was asleep in his bassinet and [name] was playing with her 1 year old daughter on the bed.  [name] reports that she and baby are doing well, however labor was "long."  She states "this was the best pregnancy ever," and that she has a good support system.  She reports no concerns at this time.   CSW inquired about PNC/Lack of PNC, acknowledging that [name] has spoken with some of CSW's coworkers in the past about this.  [name] became tearful and states that "it's the same story every time."  She informed CSW that she went to the doctor as directed with her first daughter, but her first daughter died.  She concludes then, that there is no point to going to the doctor since even when she did, her pregnancy ended with a negative outcome.  She added that "every time I go to the doctor, I have to talk about it (her loss), and I don't want   to talk about it."  She said, "I have a daughter," and pointed to her 1 year old.  CSW gently suggested that this daughter did not replace her first.  [name] immediately agreed and softened her defenses.  CSW apologized for creating a conversation which caused her to discuss the loss of her first daughter, but encouraged her to acknowledge her daughter as a part of her family.  CSW acknowledged that [name] said she did not want to talk about her loss, and CSW did not ask her to any further, however, encouraged her to consider getting involved with [facility] as a way to memorialize her daughter and begin to process and heal from this extreme grief.  [name] was accepting of the resource information and thanked CSW.  She seemed appreciative.  She thanked CSW for acknowledging this baby as her 5th child.  [name] was understanding of  hospital drug screen policy and denies drug use.  She is aware of mandatory CPS reporting for positive screens.  She was receptive to discussing PMADs and states hs of anxiety and depression while dealing with grief and loss after her daughter's passing.  CSW Plan/Description:  No Further Intervention Required/No Barriers to Discharge, Sudden Infant Death Syndrome (SIDS) Education, Perinatal Mood and Anxiety Disorder (PMADs) Education, Other Information/Referral to Community Resources, CSW Will Continue to Monitor Umbilical Cord Tissue Drug Screen Results and Make Report if Warranted    Ayeisha Lindenberger Elizabeth, LCSW 05/08/2017, 3:11 PM 

## 2017-05-08 NOTE — Discharge Instructions (Signed)

## 2018-07-18 NOTE — L&D Delivery Note (Signed)
Patient: RHYDER GRANDIN MRN: [id number]  GBS status: unknown, IAP given: PCN  Patient is a 28 y.o. now G6P5 (hx of 38w demise) s/p NSVD at [redacted]w[redacted]d, who was admitted for IOL for gHTN/early labor. AROM 1h 61m prior to delivery with clear fluid.    Delivery Note At 11:57 AM a viable female was delivered via VBAC, Spontaneous (Presentation: cephalic; LOA).  APGAR: 8, 9; weight: pending (appears AGA)  .   Placenta status: intact, 3-vessel cord.  Cord:  with the following complications: nuchal x 1.  Cord pH: not collected.  Head delivered LOA, precipitously by nurse with provider in room. Nuchal cord present, loose x 1, delivered through. Shoulder and body delivered in usual fashion. Infant with spontaneous cry, placed on mother's abdomen, dried and bulb suctioned. Cord clamped x 2 after 1-minute delay, and cut by family member. Cord blood drawn. Placenta delivered spontaneously with gentle cord traction. Fundus firm with massage and Pitocin. Perineum inspected and found to have no laceration, which was found to be hemostatic.  Anesthesia: Epidural    Episiotomy: None Lacerations: None Suture Repair: None Est. Blood Loss (mL): 100  Mom to postpartum.  Baby to Couplet care / Skin to Skin.  Gwenevere Abbot 09/05/2018, 12:21 PM

## 2018-09-04 ENCOUNTER — Other Ambulatory Visit: Payer: Self-pay

## 2018-09-04 ENCOUNTER — Encounter (HOSPITAL_COMMUNITY): Payer: Self-pay | Admitting: *Deleted

## 2018-09-04 ENCOUNTER — Inpatient Hospital Stay (HOSPITAL_COMMUNITY): Payer: Medicaid Other

## 2018-09-04 ENCOUNTER — Inpatient Hospital Stay (HOSPITAL_COMMUNITY)
Admission: AD | Admit: 2018-09-04 | Discharge: 2018-09-07 | DRG: 807 | Disposition: A | Discharge status: Home / Self Care | Payer: Medicaid Other | Attending: Family Medicine | Admitting: Family Medicine

## 2018-09-04 DIAGNOSIS — O0933 Supervision of pregnancy with insufficient antenatal care, third trimester: Secondary | ICD-10-CM

## 2018-09-04 DIAGNOSIS — O480 Post-term pregnancy: Secondary | ICD-10-CM | POA: Diagnosis not present

## 2018-09-04 DIAGNOSIS — O139 Gestational [pregnancy-induced] hypertension without significant proteinuria, unspecified trimester: Secondary | ICD-10-CM

## 2018-09-04 DIAGNOSIS — O0930 Supervision of pregnancy with insufficient antenatal care, unspecified trimester: Secondary | ICD-10-CM

## 2018-09-04 DIAGNOSIS — O163 Unspecified maternal hypertension, third trimester: Secondary | ICD-10-CM

## 2018-09-04 DIAGNOSIS — O133 Gestational [pregnancy-induced] hypertension without significant proteinuria, third trimester: Secondary | ICD-10-CM

## 2018-09-04 DIAGNOSIS — Z3A40 40 weeks gestation of pregnancy: Secondary | ICD-10-CM

## 2018-09-04 DIAGNOSIS — O34219 Maternal care for unspecified type scar from previous cesarean delivery: Secondary | ICD-10-CM | POA: Diagnosis present

## 2018-09-04 DIAGNOSIS — O99214 Obesity complicating childbirth: Secondary | ICD-10-CM | POA: Diagnosis present

## 2018-09-04 DIAGNOSIS — O99343 Other mental disorders complicating pregnancy, third trimester: Secondary | ICD-10-CM

## 2018-09-04 DIAGNOSIS — O134 Gestational [pregnancy-induced] hypertension without significant proteinuria, complicating childbirth: Principal | ICD-10-CM | POA: Diagnosis present

## 2018-09-04 DIAGNOSIS — O99213 Obesity complicating pregnancy, third trimester: Secondary | ICD-10-CM

## 2018-09-04 DIAGNOSIS — O09293 Supervision of pregnancy with other poor reproductive or obstetric history, third trimester: Secondary | ICD-10-CM

## 2018-09-04 DIAGNOSIS — Z3490 Encounter for supervision of normal pregnancy, unspecified, unspecified trimester: Secondary | ICD-10-CM | POA: Diagnosis present

## 2018-09-04 DIAGNOSIS — O09299 Supervision of pregnancy with other poor reproductive or obstetric history, unspecified trimester: Secondary | ICD-10-CM

## 2018-09-04 HISTORY — DX: Unspecified abnormal cytological findings in specimens from vagina: R87.629

## 2018-09-04 LAB — CBC
HEMATOCRIT: 33.7 % — AB (ref 36.0–46.0)
Hemoglobin: 11.2 g/dL — ABNORMAL LOW (ref 12.0–15.0)
MCH: 28.9 pg (ref 26.0–34.0)
MCHC: 33.2 g/dL (ref 30.0–36.0)
MCV: 86.9 fL (ref 80.0–100.0)
NRBC: 0 % (ref 0.0–0.2)
PLATELETS: 193 10*3/uL (ref 150–400)
RBC: 3.88 MIL/uL (ref 3.87–5.11)
RDW: 12.8 % (ref 11.5–15.5)
WBC: 7.3 10*3/uL (ref 4.0–10.5)

## 2018-09-04 LAB — COMPREHENSIVE METABOLIC PANEL
ALBUMIN: 3.4 g/dL — AB (ref 3.5–5.0)
ALT: 14 U/L (ref 0–44)
AST: 20 U/L (ref 15–41)
Alkaline Phosphatase: 144 U/L — ABNORMAL HIGH (ref 38–126)
Anion gap: 10 (ref 5–15)
BUN: 6 mg/dL (ref 6–20)
CHLORIDE: 106 mmol/L (ref 98–111)
CO2: 20 mmol/L — ABNORMAL LOW (ref 22–32)
Calcium: 8.8 mg/dL — ABNORMAL LOW (ref 8.9–10.3)
Creatinine, Ser: 0.52 mg/dL (ref 0.44–1.00)
GFR calc Af Amer: >60 mL/min (ref >60)
GFR calc non Af Amer: >60 mL/min (ref >60)
GLUCOSE: 71 mg/dL (ref 70–99)
POTASSIUM: 3.4 mmol/L — AB (ref 3.5–5.1)
Sodium: 136 mmol/L (ref 135–145)
Total Bilirubin: 0.5 mg/dL (ref 0.3–1.2)
Total Protein: 7.1 g/dL (ref 6.5–8.1)

## 2018-09-04 LAB — RAPID URINE DRUG SCREEN, HOSP PERFORMED
AMPHETAMINES: NOT DETECTED
BENZODIAZEPINES: NOT DETECTED
Barbiturates: NOT DETECTED
COCAINE: NOT DETECTED
OPIATES: NOT DETECTED
TETRAHYDROCANNABINOL: NOT DETECTED

## 2018-09-04 LAB — URINALYSIS, MICROSCOPIC (REFLEX): RBC / HPF: NONE SEEN RBC/hpf (ref 0–5)

## 2018-09-04 LAB — URINALYSIS, ROUTINE W REFLEX MICROSCOPIC
BILIRUBIN URINE: NEGATIVE
Glucose, UA: NEGATIVE mg/dL
Hgb urine dipstick: NEGATIVE
NITRITE: NEGATIVE
Protein, ur: NEGATIVE mg/dL
SPECIFIC GRAVITY, URINE: 1.025 (ref 1.005–1.030)
pH: 7 (ref 5.0–8.0)

## 2018-09-04 LAB — PROTEIN / CREATININE RATIO, URINE
Creatinine, Urine: 311 mg/dL
Protein Creatinine Ratio: 0.04 mg/mg{Cre} (ref 0.00–0.15)
Total Protein, Urine: 12 mg/dL

## 2018-09-04 LAB — TYPE AND SCREEN
ABO/RH(D): B POS
Antibody Screen: NEGATIVE

## 2018-09-04 MED ORDER — ONDANSETRON HCL 4 MG/2ML IJ SOLN: 4.0000 mg | Freq: Four times a day (QID) | INTRAMUSCULAR | Status: DC | PRN

## 2018-09-04 MED ORDER — OXYCODONE-ACETAMINOPHEN 5-325 MG PO TABS: 2.0000 | ORAL_TABLET | ORAL | Status: DC | PRN

## 2018-09-04 MED ORDER — LACTATED RINGERS IV SOLN
INTRAVENOUS | Status: DC
  Administered 2018-09-04 – 2018-09-05 (×2): via INTRAVENOUS

## 2018-09-04 MED ORDER — OXYCODONE-ACETAMINOPHEN 5-325 MG PO TABS: 1.0000 | ORAL_TABLET | ORAL | Status: DC | PRN

## 2018-09-04 MED ORDER — OXYTOCIN 40 UNITS IN NORMAL SALINE INFUSION - SIMPLE MED: 2.5000 [IU]/h | INTRAVENOUS | Status: DC

## 2018-09-04 MED ORDER — LIDOCAINE HCL (PF) 1 % IJ SOLN
30.0000 mL | INTRAMUSCULAR | Status: DC | PRN
  Filled 2018-09-04: qty 30

## 2018-09-04 MED ORDER — OXYTOCIN BOLUS FROM INFUSION
500.0000 mL | Freq: Once | INTRAVENOUS | Status: AC
  Administered 2018-09-05: 500 mL via INTRAVENOUS

## 2018-09-04 MED ORDER — SOD CITRATE-CITRIC ACID 500-334 MG/5ML PO SOLN: 30.0000 mL | ORAL | Status: DC | PRN

## 2018-09-04 MED ORDER — TERBUTALINE SULFATE 1 MG/ML IJ SOLN: 0.2500 mg | Freq: Once | INTRAMUSCULAR | Status: DC | PRN

## 2018-09-04 MED ORDER — ACETAMINOPHEN 325 MG PO TABS: 650.0000 mg | ORAL_TABLET | ORAL | Status: DC | PRN

## 2018-09-04 MED ORDER — PENICILLIN G 3 MILLION UNITS IVPB - SIMPLE MED
3.0000 10*6.[IU] | INTRAVENOUS | Status: DC
  Administered 2018-09-05 (×2): 3 10*6.[IU] via INTRAVENOUS
  Filled 2018-09-04: qty 100

## 2018-09-04 MED ORDER — FENTANYL CITRATE (PF) 100 MCG/2ML IJ SOLN
50.0000 ug | INTRAMUSCULAR | Status: DC | PRN
  Administered 2018-09-05 (×3): 100 ug via INTRAVENOUS
  Filled 2018-09-04 (×3): qty 2

## 2018-09-04 MED ORDER — OXYTOCIN 40 UNITS IN NORMAL SALINE INFUSION - SIMPLE MED
1.0000 m[IU]/min | INTRAVENOUS | Status: DC
  Administered 2018-09-04: 2 m[IU]/min via INTRAVENOUS
  Filled 2018-09-04: qty 1000

## 2018-09-04 MED ORDER — SODIUM CHLORIDE 0.9 % IV SOLN
5.0000 10*6.[IU] | Freq: Once | INTRAVENOUS | Status: AC
  Administered 2018-09-04: 5 10*6.[IU] via INTRAVENOUS
  Filled 2018-09-04: qty 5

## 2018-09-04 MED ORDER — LACTATED RINGERS IV SOLN
500.0000 mL | INTRAVENOUS | Status: DC | PRN
  Administered 2018-09-04: 500 mL via INTRAVENOUS

## 2018-09-04 NOTE — Progress Notes (Addendum)
FB placed without difficulty, patient tolerate procedure well. Inflated with 60cc fluid. Will plan to start pitocin, max at 10 until FB out. Patient also with history of C/S and VBAC x 3. Patient verbally consents to induction.   Cristal Deer. Earlene Plater, DO OB/GYN Fellow

## 2018-09-04 NOTE — Plan of Care (Signed)
  Problem: Education: Goal: Knowledge of Childbirth will improve Outcome: Progressing Goal: Ability to make informed decisions regarding treatment and plan of care will improve Outcome: Progressing Goal: Ability to state and carry out methods to decrease the pain will improve Outcome: Progressing   Problem: Coping: Goal: Ability to verbalize concerns and feelings about labor and delivery will improve Outcome: Progressing   Problem: Life Cycle: Goal: Ability to make normal progression through stages of labor will improve Outcome: Progressing Goal: Ability to effectively push during vaginal delivery will improve Outcome: Progressing   Problem: Role Relationship: Goal: Ability to demonstrate positive interaction with the child will improve Outcome: Progressing   Problem: Safety: Goal: Risk of complications during labor and delivery will decrease Outcome: Progressing   Problem: Pain Management: Goal: Relief or control of pain from uterine contractions will improve Outcome: Progressing   Problem: Health Behavior/Discharge Planning: Goal: Ability to manage health-related needs will improve Outcome: Progressing   Problem: Clinical Measurements: Goal: Ability to maintain clinical measurements within normal limits will improve Outcome: Progressing Goal: Will remain free from infection Outcome: Progressing Goal: Diagnostic test results will improve Outcome: Progressing Goal: Respiratory complications will improve Outcome: Progressing Goal: Cardiovascular complication will be avoided Outcome: Progressing   Problem: Activity: Goal: Risk for activity intolerance will decrease Outcome: Progressing   Problem: Nutrition: Goal: Adequate nutrition will be maintained Outcome: Progressing   Problem: Elimination: Goal: Will not experience complications related to bowel motility Outcome: Progressing Goal: Will not experience complications related to urinary retention Outcome:  Progressing   Problem: Pain Managment: Goal: General experience of comfort will improve Outcome: Progressing   Problem: Skin Integrity: Goal: Risk for impaired skin integrity will decrease Outcome: Progressing   

## 2018-09-04 NOTE — H&P (Addendum)
LABOR AND DELIVERY ADMISSION HISTORY AND PHYSICAL NOTE  Kimberly [name] is a 28 y.o. female [phone] with IUP at [redacted]w[redacted]d by L/16 presenting to [facility] with early labor and was found to have elevated blood pressures. She has had no prenatal care, so she was offered induction for term and presumed gestational hypertension. She reports positive fetal movement. She denies leakage of fluid or vaginal bleeding.  Prenatal History/Complications: Patient had no PNC. [facility] for [facility] that gave her a date of 16 weeks. Pregnancy complications:  - No PNC - Hx of gHTN - Hx of prior c-section in 2014, has had 3 vaginal births after  Past Medical History: Past Medical History:  Diagnosis Date  . Anxiety    because of last preg,scared something is wrong  . Chlamydia   . Constipation   . Depression    after loss of preg/child  . Gonorrhea   . Hypertension   . Pregnancy induced hypertension   . Vaginal Pap smear, abnormal     Past Surgical History: Past Surgical History:  Procedure Laterality Date  . CESAREAN SECTION N/A 03/20/2013   Procedure: CESAREAN SECTION;  Surgeon: Kathreen Cosier, MD;  Location: [facility];  Service: Obstetrics;  Laterality: N/A;    Obstetrical History: OB History    Gravida  6   Para  5   Term  5   Preterm  0   AB  0   Living  4     SAB  0   TAB  0   Ectopic  0   Multiple  0   Live Births  4           Social History: Social History   Socioeconomic History  . Marital status: Single    Spouse name: Not on file  . Number of children: Not on file  . Years of education: Not on file  . Highest education level: Not on file  Occupational History  . Not on file  Social Needs  . Financial resource strain: Somewhat hard  . Food insecurity:    Worry: Sometimes true    Inability: Sometimes true  . Transportation needs:    Medical: Yes    Non-medical: No  Tobacco Use  . Smoking status: Never Smoker  .  Smokeless tobacco: Never Used  Substance and Sexual Activity  . Alcohol use: No  . Drug use: No  . Sexual activity: Yes    Birth control/protection: None  Lifestyle  . Physical activity:    Days per week: Patient refused    Minutes per session: Patient refused  . Stress: Not at all  Relationships  . Social connections:    Talks on phone: Patient refused    Gets together: Patient refused    Attends religious service: Patient refused    Active member of club or organization: Patient refused    Attends meetings of clubs or organizations: Patient refused    Relationship status: Patient refused  Other Topics Concern  . Not on file  Social History Narrative  . Not on file    Family History: Family History  Problem Relation Age of Onset  . Asthma Son   . Hypertension Maternal Grandmother   . Varicose Veins Maternal Grandmother   . Cancer Neg Hx   . Diabetes Neg Hx   . Heart disease Neg Hx   . Stroke Neg Hx   . Hearing loss Neg Hx  Allergies: No Known Allergies  Medications Prior to Admission  Medication Sig Dispense Refill Last Dose  . ibuprofen (ADVIL,MOTRIN) 600 MG tablet Take 1 tablet (600 mg total) by mouth every 6 (six) hours as needed. 40 tablet 1      Review of Systems  All systems reviewed and negative except as stated in HPI  Physical Exam Blood pressure (!) 148/82, pulse 96, temperature 98.8 F (37.1 C), temperature source Oral, resp. rate 19, height 5\' 3"  (1.6 m), weight 111.1 kg, SpO2 100 %, unknown if currently breastfeeding. General appearance: alert, cooperative and no distress Lungs: clear to auscultation bilaterally Heart: regular rate and rhythm Abdomen: soft, non-tender; bowel sounds normal Extremities: No calf swelling or tenderness Fetal monitoring: Cat 1 Uterine activity: irregular contractions 5 min apart or greater Dilation: 1.5 Effacement (%): 60 Station: -3 Exam by:: Estanislado SpireE. Lawrence, NP  Prenatal labs: ABO, Rh:  pending Antibody:   pending Rubella:  not perfromed RPR:   pending HBsAg:   pending HIV:   pending GC/Chlamydia: pending GBS:   unknown 1 hr Glucola: not done Genetic screening:  none Anatomy US: one [facility]; unknown result  Prenatal Transfer Tool  Maternal Diabetes: No Genetic Screening: Declined Maternal Ultrasounds/Referrals: Normal Fetal Ultrasounds or other Referrals:  None Maternal Substance Abuse:  No Significant Maternal Medications:  None Significant Maternal Lab Results: None  Results for orders placed or performed during the hospital encounter of 09/04/18 (from the past 24 hour(s))  Urinalysis, Routine w reflex microscopic   Collection Time: 09/04/18  6:17 PM  Result Value Ref Range   Color, Urine YELLOW YELLOW   APPearance CLEAR CLEAR   Specific Gravity, Urine 1.025 1.005 - 1.030   pH 7.0 5.0 - 8.0   Glucose, UA NEGATIVE NEGATIVE mg/dL   Hgb urine dipstick NEGATIVE NEGATIVE   Bilirubin Urine NEGATIVE NEGATIVE   Ketones, ur >80 (A) NEGATIVE mg/dL   Protein, ur NEGATIVE NEGATIVE mg/dL   Nitrite NEGATIVE NEGATIVE   Leukocytes,Ua TRACE (A) NEGATIVE  Protein / creatinine ratio, urine   Collection Time: 09/04/18  6:17 PM  Result Value Ref Range   Creatinine, Urine 311.00 mg/dL   Total Protein, Urine 12 mg/dL   Protein Creatinine Ratio 0.04 0.00 - 0.15 mg/mg[Cre]  Rapid urine drug screen (hospital performed)   Collection Time: 09/04/18  6:17 PM  Result Value Ref Range   Opiates NONE DETECTED NONE DETECTED   Cocaine NONE DETECTED NONE DETECTED   Benzodiazepines NONE DETECTED NONE DETECTED   Amphetamines NONE DETECTED NONE DETECTED   Tetrahydrocannabinol NONE DETECTED NONE DETECTED   Barbiturates NONE DETECTED NONE DETECTED  Urinalysis, Microscopic (reflex)   Collection Time: 09/04/18  6:17 PM  Result Value Ref Range   RBC / HPF NONE SEEN 0 - 5 RBC/hpf   WBC, UA 0-5 0 - 5 WBC/hpf   Bacteria, UA FEW (A) NONE SEEN   Squamous Epithelial / LPF 0-5 0 - 5    Mucus PRESENT   CBC   Collection Time: 09/04/18  6:27 PM  Result Value Ref Range   WBC 7.3 4.0 - 10.5 K/uL   RBC 3.88 3.87 - 5.11 MIL/uL   Hemoglobin 11.2 (L) 12.0 - 15.0 g/dL   HCT 11.933.7 (L) 14.736.0 - 82.946.0 %   MCV 86.9 80.0 - 100.0 fL   MCH 28.9 26.0 - 34.0 pg   MCHC 33.2 30.0 - 36.0 g/dL   RDW 56.212.8 13.011.5 - 86.515.5 %   Platelets 193 150 - 400 K/uL  nRBC 0.0 0.0 - 0.2 %  Comprehensive metabolic panel   Collection Time: 09/04/18  6:27 PM  Result Value Ref Range   Sodium 136 135 - 145 mmol/L   Potassium 3.4 (L) 3.5 - 5.1 mmol/L   Chloride 106 98 - 111 mmol/L   CO2 20 (L) 22 - 32 mmol/L   Glucose, Bld 71 70 - 99 mg/dL   BUN 6 6 - 20 mg/dL   Creatinine, Ser 0.86 0.44 - 1.00 mg/dL   Calcium 8.8 (L) 8.9 - 10.3 mg/dL   Total Protein 7.1 6.5 - 8.1 g/dL   Albumin 3.4 (L) 3.5 - 5.0 g/dL   AST 20 15 - 41 U/L   ALT 14 0 - 44 U/L   Alkaline Phosphatase 144 (H) 38 - 126 U/L   Total Bilirubin 0.5 0.3 - 1.2 mg/dL   GFR calc non Af Amer >60 >60 mL/min   GFR calc Af Amer >60 >60 mL/min   Anion gap 10 5 - 15    Patient Active Problem List   Diagnosis Date Noted  . Encounter for induction of labor 09/04/2018  . Morbid obesity (HCC) 09/04/2018  . Hypertension affecting pregnancy in third trimester 09/20/2014  . Hx of intrauterine fetal death, currently pregnant     Assessment: Kimberly [name] is a 28 y.o. [phone] at [redacted]w[redacted]d here for IOL due to elevated blood pressures at term.   #Labor: will start with Foley bulb and pitocin #Pain: Anethesia/analgesics prn; epidural on request #FWB:  Cat 1 #ID: GBS status unknown with history of positive GBS status in prior pregnancy, will administer penicillin  #MOF: bottle #MOC: Depo #Circ: sex unknown; if boy, no circ  Tamera Stands 09/04/2018, 10:27 PM   Attestation: I have seen this patient and agree with the resident's documentation. I have examined them separately, and we have discussed the plan of care. Elevated Bps in MAU, history of high  blood pressure postpartum. HELLP labs wnl, UPC undetectable. Continue to monitor closely for symptoms. Prenatal labs drawn on admission.   Cristal Deer. Earlene Plater, DO OB/GYN Fellow

## 2018-09-04 NOTE — MAU Provider Note (Signed)
Chief Complaint:  Contractions   First Provider Initiated Contact with Patient 09/04/18 1847     HPI: Kimberly [name] is a 28 y.o. [id number] at 4042w0d who presents to maternity admissions reporting contractions. Has had intermittent contractions over the last 3 days. Sometimes they are 5 minutes apart. Denies vaginal bleeding or LOF. Normal fetal movement. Had ultrasound at [facility] when she was 4 months pregnant that gave her a due date of 09/04/2018. Has had no prenatal care with this pregnancy.  Her 2nd pregnancy was a term c/section for placental abruption that resulted in fetal death. Had 3 term VBAC after. Reports hx of GHTN with her last 3 pregnancies. Denies hypertension outside of pregnancy. Denies headache, visual disturbance, or epigastric pain.   Location: pelvis Quality: contractions Severity: 5/10 in pain scale Duration: 3 days Timing: every 5 minutes Modifying factors: none Associated signs and symptoms: none  Pregnancy Course: no prenatal care  Past Medical History:  Diagnosis Date  . Anxiety    because of last preg,scared something is wrong  . Chlamydia   . Constipation   . Depression    after loss of preg/child  . Gonorrhea   . Hypertension   . Pregnancy induced hypertension    OB History  Gravida Para Term Preterm AB Living  6 5 5  0 0 4  SAB TAB Ectopic Multiple Live Births  0 0 0 0 4    # Outcome Date GA Lbr Len/2nd Weight Sex Delivery Anes PTL Lv  6 Current           5 Term 05/07/17 2541w6d 14:06 / 00:15 3555 g M VBAC EPI  LIV  4 Term 02/26/16 5220w5d 07:11 / 00:02 3345 g F VBAC None  LIV  3 Term 09/21/14 109w2d 12:10 / 00:10 2515 g M VBAC EPI  LIV  2 Term 03/20/13 1870w4d  2376 g  CS-LTranv Gen  FD  1 Term 11/24/08 4042w0d  2722 g M Vag-Spont EPI  LIV     Birth Comments: No complications   Past Surgical History:  Procedure Laterality Date  . CESAREAN SECTION N/A 03/20/2013   Procedure: CESAREAN SECTION;  Surgeon: Kathreen CosierBernard A Marshall, MD;  Location:  [facility];  Service: Obstetrics;  Laterality: N/A;   Family History  Problem Relation Age of Onset  . Asthma Son   . Cancer Neg Hx   . Diabetes Neg Hx   . Heart disease Neg Hx   . Hypertension Neg Hx   . Stroke Neg Hx   . Hearing loss Neg Hx    Social History   Tobacco Use  . Smoking status: Never Smoker  . Smokeless tobacco: Never Used  Substance Use Topics  . Alcohol use: No  . Drug use: No   No Known Allergies Medications Prior to Admission  Medication Sig Dispense Refill Last Dose  . ibuprofen (ADVIL,MOTRIN) 600 MG tablet Take 1 tablet (600 mg total) by mouth every 6 (six) hours as needed. 40 tablet 1     I have reviewed patient's Past Medical Hx, Surgical Hx, Family Hx, Social Hx, medications and allergies.   ROS:  Review of Systems  Constitutional: Negative.   Eyes: Negative for visual disturbance.  Gastrointestinal: Positive for abdominal pain.  Genitourinary: Negative.   Musculoskeletal: Negative.   Neurological: Negative for headaches.    Physical Exam   Patient Vitals for the past 24 hrs:  BP Temp Temp src Pulse Resp SpO2 Height Weight  09/04/18 2059 139/75 - -  95 - - - -  09/04/18 2032 - - - 80 - - - -  09/04/18 2021 140/64 - - 99 - - - -  09/04/18 2020 140/64 98.9 F (37.2 C) Oral (!) 104 18 100 % - -  09/04/18 1930 136/75 - - 100 - - - -  09/04/18 1915 132/79 - - (!) 108 - - - -  09/04/18 1900 127/83 - - (!) 106 - - - -  09/04/18 1845 138/77 - - 94 - - - -  09/04/18 1844 139/78 - - (!) 101 - - - -  09/04/18 1822 (!) 145/93 - - (!) 109 - - - -  09/04/18 1802 (!) 154/99 99.7 F (37.6 C) Oral (!) 102 17 100 % 5\' 3"  (1.6 m) 111.1 kg    Constitutional: Well-developed, well-nourished female in no acute distress.  Cardiovascular: normal rate & rhythm, no murmur Respiratory: normal effort, lung sounds clear throughout GI: Abd soft, non-tender, gravid appropriate for gestational age. Pos BS x 4 MS: Extremities nontender, no edema, normal ROM Neurologic:  Alert and oriented x 4.  GU:      Pelvic: NEFG, physiologic discharge, no blood, cervix clean.   Dilation: 1.5 Effacement (%): 60 Station: -3 Presentation: Vertex Exam by:: Estanislado Spire, NP  NST:  Baseline: 140 bpm, Variability: Good {> 6 bpm), Accelerations: Reactive and Decelerations: Absent   Labs: Results for orders placed or performed during the hospital encounter of 09/04/18 (from the past 24 hour(s))  Urinalysis, Routine w reflex microscopic     Status: Abnormal   Collection Time: 09/04/18  6:17 PM  Result Value Ref Range   Color, Urine YELLOW YELLOW   APPearance CLEAR CLEAR   Specific Gravity, Urine 1.025 1.005 - 1.030   pH 7.0 5.0 - 8.0   Glucose, UA NEGATIVE NEGATIVE mg/dL   Hgb urine dipstick NEGATIVE NEGATIVE   Bilirubin Urine NEGATIVE NEGATIVE   Ketones, ur >80 (A) NEGATIVE mg/dL   Protein, ur NEGATIVE NEGATIVE mg/dL   Nitrite NEGATIVE NEGATIVE   Leukocytes,Ua TRACE (A) NEGATIVE  Protein / creatinine ratio, urine     Status: None   Collection Time: 09/04/18  6:17 PM  Result Value Ref Range   Creatinine, Urine 311.00 mg/dL   Total Protein, Urine 12 mg/dL   Protein Creatinine Ratio 0.04 0.00 - 0.15 mg/mg[Cre]  Rapid urine drug screen (hospital performed)     Status: None   Collection Time: 09/04/18  6:17 PM  Result Value Ref Range   Opiates NONE DETECTED NONE DETECTED   Cocaine NONE DETECTED NONE DETECTED   Benzodiazepines NONE DETECTED NONE DETECTED   Amphetamines NONE DETECTED NONE DETECTED   Tetrahydrocannabinol NONE DETECTED NONE DETECTED   Barbiturates NONE DETECTED NONE DETECTED  Urinalysis, Microscopic (reflex)     Status: Abnormal   Collection Time: 09/04/18  6:17 PM  Result Value Ref Range   RBC / HPF NONE SEEN 0 - 5 RBC/hpf   WBC, UA 0-5 0 - 5 WBC/hpf   Bacteria, UA FEW (A) NONE SEEN   Squamous Epithelial / LPF 0-5 0 - 5   Mucus PRESENT   CBC     Status: Abnormal   Collection Time: 09/04/18  6:27 PM  Result Value Ref Range   WBC 7.3 4.0 - 10.5  K/uL   RBC 3.88 3.87 - 5.11 MIL/uL   Hemoglobin 11.2 (L) 12.0 - 15.0 g/dL   HCT 75.7 (L) 97.2 - 82.0 %   MCV 86.9 80.0 - 100.0 fL  MCH 28.9 26.0 - 34.0 pg   MCHC 33.2 30.0 - 36.0 g/dL   RDW 22.9 79.8 - 92.1 %   Platelets 193 150 - 400 K/uL   nRBC 0.0 0.0 - 0.2 %  Comprehensive metabolic panel     Status: Abnormal   Collection Time: 09/04/18  6:27 PM  Result Value Ref Range   Sodium 136 135 - 145 mmol/L   Potassium 3.4 (L) 3.5 - 5.1 mmol/L   Chloride 106 98 - 111 mmol/L   CO2 20 (L) 22 - 32 mmol/L   Glucose, Bld 71 70 - 99 mg/dL   BUN 6 6 - 20 mg/dL   Creatinine, Ser 1.94 0.44 - 1.00 mg/dL   Calcium 8.8 (L) 8.9 - 10.3 mg/dL   Total Protein 7.1 6.5 - 8.1 g/dL   Albumin 3.4 (L) 3.5 - 5.0 g/dL   AST 20 15 - 41 U/L   ALT 14 0 - 44 U/L   Alkaline Phosphatase 144 (H) 38 - 126 U/L   Total Bilirubin 0.5 0.3 - 1.2 mg/dL   GFR calc non Af Amer >60 >60 mL/min   GFR calc Af Amer >60 >60 mL/min   Anion gap 10 5 - 15    Imaging:  No results found.  MAU Course: Orders Placed This Encounter  Procedures  . [place] MFM OB COMP + 14 WK  . Urinalysis, Routine w reflex microscopic  . CBC  . Comprehensive metabolic panel  . Protein / creatinine ratio, urine  . RPR  . HIV Antibody (routine testing w rflx)  . Rubella screen  . Hepatitis B surface antigen  . Rapid urine drug screen (hospital performed)  . Urinalysis, Microscopic (reflex)   No orders of the defined types were placed in this encounter.   MDM: Pt with hypertension. PEC labs normal & pt asymptomatic.  Pt gives EDD based on ultrasound that we don't have record of. Fundal height 41 cm.  Ultrasound for dating, Measuring [redacted]w[redacted]d with best dating of 5wks C/w Dr. Adrian Blackwater. Will induce for GHTN.   Assessment: 1. Gestational hypertension without significant proteinuria   2. No prenatal care in current pregnancy in third trimester     Plan: Admit to birthing suites Monitor BPs Care turned over to labor team  Judeth Horn,  NP 09/04/2018 9:15 PM

## 2018-09-04 NOTE — MAU Note (Signed)
Pt reports contractions since last pm, not really strong but she is feeling pressure this pm. Denies bleeding or ROM

## 2018-09-05 ENCOUNTER — Inpatient Hospital Stay (HOSPITAL_COMMUNITY): Payer: Medicaid Other | Admitting: Anesthesiology

## 2018-09-05 ENCOUNTER — Encounter (HOSPITAL_COMMUNITY): Payer: Self-pay | Admitting: *Deleted

## 2018-09-05 DIAGNOSIS — Z3A40 40 weeks gestation of pregnancy: Secondary | ICD-10-CM

## 2018-09-05 DIAGNOSIS — O134 Gestational [pregnancy-induced] hypertension without significant proteinuria, complicating childbirth: Secondary | ICD-10-CM

## 2018-09-05 DIAGNOSIS — O480 Post-term pregnancy: Secondary | ICD-10-CM

## 2018-09-05 DIAGNOSIS — O139 Gestational [pregnancy-induced] hypertension without significant proteinuria, unspecified trimester: Secondary | ICD-10-CM | POA: Diagnosis present

## 2018-09-05 LAB — CBC WITH DIFFERENTIAL/PLATELET
Basophils Absolute: 0 10*3/uL (ref 0.0–0.1)
Basophils Relative: 0 %
Eosinophils Absolute: 0 10*3/uL (ref 0.0–0.5)
Eosinophils Relative: 0 %
HCT: 31.7 % — ABNORMAL LOW (ref 36.0–46.0)
Hemoglobin: 10.6 g/dL — ABNORMAL LOW (ref 12.0–15.0)
Lymphocytes Relative: 17 %
Lymphs Abs: 1.3 10*3/uL (ref 0.7–4.0)
MCH: 29 pg (ref 26.0–34.0)
MCHC: 33.4 g/dL (ref 30.0–36.0)
MCV: 86.8 fL (ref 80.0–100.0)
Monocytes Absolute: 0.5 10*3/uL (ref 0.1–1.0)
Monocytes Relative: 7 %
Neutro Abs: 5.8 10*3/uL (ref 1.7–7.7)
Neutrophils Relative %: 76 %
PLATELETS: 181 10*3/uL (ref 150–400)
RBC: 3.65 MIL/uL — ABNORMAL LOW (ref 3.87–5.11)
RDW: 12.9 % (ref 11.5–15.5)
WBC: 7.6 10*3/uL (ref 4.0–10.5)
nRBC: 0 % (ref 0.0–0.2)

## 2018-09-05 LAB — RUBELLA SCREEN: Rubella: 1.07 index (ref >0.99)

## 2018-09-05 LAB — HIV ANTIBODY (ROUTINE TESTING W REFLEX): HIV SCREEN 4TH GENERATION: NONREACTIVE

## 2018-09-05 LAB — RPR: RPR: NONREACTIVE

## 2018-09-05 MED ORDER — TETANUS-DIPHTH-ACELL PERTUSSIS 5-2.5-18.5 LF-MCG/0.5 IM SUSP
0.5000 mL | Freq: Once | INTRAMUSCULAR | Status: DC
  Filled 2018-09-05: qty 0.5

## 2018-09-05 MED ORDER — COCONUT OIL OIL
1.0000 "application " | TOPICAL_OIL | Status: DC | PRN
  Filled 2018-09-05: qty 120

## 2018-09-05 MED ORDER — OXYTOCIN 40 UNITS IN NORMAL SALINE INFUSION - SIMPLE MED: 2.5000 [IU]/h | INTRAVENOUS | Status: DC

## 2018-09-05 MED ORDER — DIBUCAINE 1 % RE OINT
1.0000 "application " | TOPICAL_OINTMENT | RECTAL | Status: DC | PRN
  Filled 2018-09-05: qty 28

## 2018-09-05 MED ORDER — ACETAMINOPHEN 325 MG PO TABS: 650.0000 mg | ORAL_TABLET | ORAL | Status: DC | PRN

## 2018-09-05 MED ORDER — PHENYLEPHRINE 40 MCG/ML (10ML) SYRINGE FOR IV PUSH (FOR BLOOD PRESSURE SUPPORT): 80.0000 ug | PREFILLED_SYRINGE | INTRAVENOUS | Status: DC | PRN

## 2018-09-05 MED ORDER — FLEET ENEMA 7-19 GM/118ML RE ENEM: 1.0000 | ENEMA | RECTAL | Status: DC | PRN

## 2018-09-05 MED ORDER — FENTANYL-BUPIVACAINE-NACL 0.5-0.125-0.9 MG/250ML-% EP SOLN: 12.0000 mL/h | EPIDURAL | Status: DC | PRN

## 2018-09-05 MED ORDER — EPHEDRINE 5 MG/ML INJ: 10.0000 mg | INTRAVENOUS | Status: DC | PRN

## 2018-09-05 MED ORDER — LACTATED RINGERS IV SOLN: INTRAVENOUS | Status: DC

## 2018-09-05 MED ORDER — DIPHENHYDRAMINE HCL 25 MG PO CAPS: 25.0000 mg | ORAL_CAPSULE | Freq: Four times a day (QID) | ORAL | Status: DC | PRN

## 2018-09-05 MED ORDER — DIPHENHYDRAMINE HCL 50 MG/ML IJ SOLN: 12.5000 mg | INTRAMUSCULAR | Status: DC | PRN

## 2018-09-05 MED ORDER — OXYTOCIN BOLUS FROM INFUSION: 500.0000 mL | Freq: Once | INTRAVENOUS | Status: DC

## 2018-09-05 MED ORDER — IBUPROFEN 600 MG PO TABS
600.0000 mg | ORAL_TABLET | Freq: Four times a day (QID) | ORAL | Status: DC
  Administered 2018-09-05 – 2018-09-07 (×8): 600 mg via ORAL
  Filled 2018-09-05 (×8): qty 1

## 2018-09-05 MED ORDER — ONDANSETRON HCL 4 MG/2ML IJ SOLN: 4.0000 mg | Freq: Four times a day (QID) | INTRAMUSCULAR | Status: DC | PRN

## 2018-09-05 MED ORDER — PHENYLEPHRINE 40 MCG/ML (10ML) SYRINGE FOR IV PUSH (FOR BLOOD PRESSURE SUPPORT)
80.0000 ug | PREFILLED_SYRINGE | INTRAVENOUS | Status: DC | PRN
  Filled 2018-09-05: qty 10

## 2018-09-05 MED ORDER — PRENATAL MULTIVITAMIN CH
1.0000 | ORAL_TABLET | Freq: Every day | ORAL | Status: DC
  Administered 2018-09-06 – 2018-09-07 (×2): 1 via ORAL
  Filled 2018-09-05 (×2): qty 1

## 2018-09-05 MED ORDER — LACTATED RINGERS IV SOLN: 500.0000 mL | INTRAVENOUS | Status: DC | PRN

## 2018-09-05 MED ORDER — LACTATED RINGERS IV SOLN
500.0000 mL | Freq: Once | INTRAVENOUS | Status: AC
  Administered 2018-09-05: 500 mL via INTRAVENOUS

## 2018-09-05 MED ORDER — ONDANSETRON HCL 4 MG PO TABS
4.0000 mg | ORAL_TABLET | ORAL | Status: DC | PRN
  Filled 2018-09-05: qty 1

## 2018-09-05 MED ORDER — SIMETHICONE 80 MG PO CHEW: 80.0000 mg | CHEWABLE_TABLET | ORAL | Status: DC | PRN

## 2018-09-05 MED ORDER — SENNOSIDES-DOCUSATE SODIUM 8.6-50 MG PO TABS
2.0000 | ORAL_TABLET | ORAL | Status: DC
  Administered 2018-09-05 – 2018-09-06 (×2): 2 via ORAL
  Filled 2018-09-05 (×2): qty 2

## 2018-09-05 MED ORDER — LIDOCAINE HCL (PF) 1 % IJ SOLN
INTRAMUSCULAR | Status: DC | PRN
  Administered 2018-09-05 (×2): 5 mL via EPIDURAL

## 2018-09-05 MED ORDER — MEASLES, MUMPS & RUBELLA VAC IJ SOLR
0.5000 mL | Freq: Once | INTRAMUSCULAR | Status: DC
  Filled 2018-09-05: qty 0.5

## 2018-09-05 MED ORDER — ONDANSETRON HCL 4 MG/2ML IJ SOLN: 4.0000 mg | INTRAMUSCULAR | Status: DC | PRN

## 2018-09-05 MED ORDER — FENTANYL-BUPIVACAINE-NACL 0.5-0.125-0.9 MG/250ML-% EP SOLN
12.0000 mL/h | EPIDURAL | Status: DC | PRN
  Filled 2018-09-05: qty 250

## 2018-09-05 MED ORDER — BENZOCAINE-MENTHOL 20-0.5 % EX AERO
1.0000 "application " | INHALATION_SPRAY | CUTANEOUS | Status: DC | PRN
  Filled 2018-09-05 (×2): qty 56

## 2018-09-05 MED ORDER — WITCH HAZEL-GLYCERIN EX PADS: 1.0000 "application " | MEDICATED_PAD | CUTANEOUS | Status: DC | PRN

## 2018-09-05 MED ORDER — OXYCODONE-ACETAMINOPHEN 5-325 MG PO TABS: 1.0000 | ORAL_TABLET | ORAL | Status: DC | PRN

## 2018-09-05 MED ORDER — LIDOCAINE HCL (PF) 1 % IJ SOLN: 30.0000 mL | INTRAMUSCULAR | Status: DC | PRN

## 2018-09-05 MED ORDER — SODIUM CHLORIDE (PF) 0.9 % IJ SOLN
INTRAMUSCULAR | Status: DC | PRN
  Administered 2018-09-05: 12 mL/h via EPIDURAL

## 2018-09-05 MED ORDER — OXYCODONE-ACETAMINOPHEN 5-325 MG PO TABS: 2.0000 | ORAL_TABLET | ORAL | Status: DC | PRN

## 2018-09-05 MED ORDER — SOD CITRATE-CITRIC ACID 500-334 MG/5ML PO SOLN
30.0000 mL | ORAL | Status: DC | PRN
  Filled 2018-09-05: qty 30

## 2018-09-05 NOTE — Progress Notes (Signed)
Kimberly [name] is a 28 y.o. [id number] at [redacted]w[redacted]d.  Subjective: Patient resting comfortably in bed with no concerns at this time. Contractions still occurring but remain irregular.  Objective: BP 118/61   Pulse 85   Temp 98.8 F (37.1 C) (Oral)   Resp 18   Ht 5\' 3"  (1.6 m)   Wt 111.1 kg   SpO2 100%   BMI 43.40 kg/m    Dilation: 1.5 Effacement (%): 60 Station: -3 Presentation: Vertex Exam by:: Dr. Earlene Plater  Labs: Results for orders placed or performed during the hospital encounter of 09/04/18 (from the past 24 hour(s))  Urinalysis, Routine w reflex microscopic     Status: Abnormal   Collection Time: 09/04/18  6:17 PM  Result Value Ref Range   Color, Urine YELLOW YELLOW   APPearance CLEAR CLEAR   Specific Gravity, Urine 1.025 1.005 - 1.030   pH 7.0 5.0 - 8.0   Glucose, UA NEGATIVE NEGATIVE mg/dL   Hgb urine dipstick NEGATIVE NEGATIVE   Bilirubin Urine NEGATIVE NEGATIVE   Ketones, ur >80 (A) NEGATIVE mg/dL   Protein, ur NEGATIVE NEGATIVE mg/dL   Nitrite NEGATIVE NEGATIVE   Leukocytes,Ua TRACE (A) NEGATIVE  Protein / creatinine ratio, urine     Status: None   Collection Time: 09/04/18  6:17 PM  Result Value Ref Range   Creatinine, Urine 311.00 mg/dL   Total Protein, Urine 12 mg/dL   Protein Creatinine Ratio 0.04 0.00 - 0.15 mg/mg[Cre]  Rapid urine drug screen (hospital performed)     Status: None   Collection Time: 09/04/18  6:17 PM  Result Value Ref Range   Opiates NONE DETECTED NONE DETECTED   Cocaine NONE DETECTED NONE DETECTED   Benzodiazepines NONE DETECTED NONE DETECTED   Amphetamines NONE DETECTED NONE DETECTED   Tetrahydrocannabinol NONE DETECTED NONE DETECTED   Barbiturates NONE DETECTED NONE DETECTED  Urinalysis, Microscopic (reflex)     Status: Abnormal   Collection Time: 09/04/18  6:17 PM  Result Value Ref Range   RBC / HPF NONE SEEN 0 - 5 RBC/hpf   WBC, UA 0-5 0 - 5 WBC/hpf   Bacteria, UA FEW (A) NONE SEEN   Squamous Epithelial / LPF 0-5 0 - 5   Mucus  PRESENT   CBC     Status: Abnormal   Collection Time: 09/04/18  6:27 PM  Result Value Ref Range   WBC 7.3 4.0 - 10.5 K/uL   RBC 3.88 3.87 - 5.11 MIL/uL   Hemoglobin 11.2 (L) 12.0 - 15.0 g/dL   HCT 79.1 (L) 50.5 - 69.7 %   MCV 86.9 80.0 - 100.0 fL   MCH 28.9 26.0 - 34.0 pg   MCHC 33.2 30.0 - 36.0 g/dL   RDW 94.8 01.6 - 55.3 %   Platelets 193 150 - 400 K/uL   nRBC 0.0 0.0 - 0.2 %  Comprehensive metabolic panel     Status: Abnormal   Collection Time: 09/04/18  6:27 PM  Result Value Ref Range   Sodium 136 135 - 145 mmol/L   Potassium 3.4 (L) 3.5 - 5.1 mmol/L   Chloride 106 98 - 111 mmol/L   CO2 20 (L) 22 - 32 mmol/L   Glucose, Bld 71 70 - 99 mg/dL   BUN 6 6 - 20 mg/dL   Creatinine, Ser 7.48 0.44 - 1.00 mg/dL   Calcium 8.8 (L) 8.9 - 10.3 mg/dL   Total Protein 7.1 6.5 - 8.1 g/dL   Albumin 3.4 (L) 3.5 - 5.0 g/dL  AST 20 15 - 41 U/L   ALT 14 0 - 44 U/L   Alkaline Phosphatase 144 (H) 38 - 126 U/L   Total Bilirubin 0.5 0.3 - 1.2 mg/dL   GFR calc non Af Amer >60 >60 mL/min   GFR calc Af Amer >60 >60 mL/min   Anion gap 10 5 - 15  Type and screen [facility]      Status: None   Collection Time: 09/04/18  9:26 PM  Result Value Ref Range   ABO/RH(D) B POS    Antibody Screen NEG    Sample Expiration      09/07/2018 Performed at [facility], [address], [place], [place] [address]     Assessment / Plan: 107w1d week IUP for IOL due to elevated BP at term Labor: Folely bulb in place with pitocin Fetal Wellbeing:  Category 1 Pain Control:  Anthesia/anaglesia as needed; epidural on request Anticipated MOD:  TOLAC  Arlyce Harman, DO 09/05/2018 1:26 AM

## 2018-09-05 NOTE — Anesthesia Procedure Notes (Signed)
Epidural Patient location during procedure: OB  Staffing Anesthesiologist: Andric Kerce, MD Performed: anesthesiologist   Preanesthetic Checklist Completed: patient identified, site marked, surgical consent, pre-op evaluation, timeout performed, IV checked, risks and benefits discussed and monitors and equipment checked  Epidural Patient position: sitting Prep: DuraPrep Patient monitoring: heart rate, continuous pulse ox and blood pressure Approach: right paramedian Location: L3-L4 Injection technique: LOR saline  Needle:  Needle type: Tuohy  Needle gauge: 17 G Needle length: 9 cm and 9 Needle insertion depth: 7 cm Catheter type: closed end flexible Catheter size: 20 Guage Catheter at skin depth: 11 cm Test dose: negative  Assessment Events: blood not aspirated, injection not painful, no injection resistance, negative IV test and no paresthesia  Additional Notes Patient identified. Risks/Benefits/Options discussed with patient including but not limited to bleeding, infection, nerve damage, paralysis, failed block, incomplete pain control, headache, blood pressure changes, nausea, vomiting, reactions to medication both or allergic, itching and postpartum back pain. Confirmed with bedside nurse the patient's most recent platelet count. Confirmed with patient that they are not currently taking any anticoagulation, have any bleeding history or any family history of bleeding disorders. Patient expressed understanding and wished to proceed. All questions were answered. Sterile technique was used throughout the entire procedure. Please see nursing notes for vital signs. Test dose was given through epidural needle and negative prior to continuing to dose epidural or start infusion. Warning signs of high block given to the patient including shortness of breath, tingling/numbness in hands, complete motor block, or any concerning symptoms with instructions to call for help. Patient was given  instructions on fall risk and not to get out of bed. All questions and concerns addressed with instructions to call with any issues.     

## 2018-09-05 NOTE — Anesthesia Postprocedure Evaluation (Signed)
Anesthesia Post Note  Patient: Kimberly [name]  Procedure(s) Performed: AN AD HOC LABOR EPIDURAL     Patient location during evaluation: Mother Baby Anesthesia Type: Epidural Level of consciousness: awake and alert and oriented Pain management: satisfactory to patient Vital Signs Assessment: post-procedure vital signs reviewed and stable Respiratory status: respiratory function stable Cardiovascular status: stable Postop Assessment: no headache, no backache, epidural receding, patient able to bend at knees, no signs of nausea or vomiting and adequate PO intake Anesthetic complications: no    Last Vitals:  Vitals:   09/05/18 1400 09/05/18 1430  BP: (!) 150/87 (!) 152/93  Pulse: 95 90  Resp: 15 16  Temp:  37.3 C  SpO2:      Last Pain:  Vitals:   09/05/18 1430  TempSrc: Axillary  PainSc: (P) 0-No pain   Pain Goal:                   Laresha Bacorn

## 2018-09-05 NOTE — Progress Notes (Signed)
LABOR PROGRESS NOTE  FENET GUIDETTI is a 28 y.o. [phone] at [redacted]w[redacted]d admitted for IOL for gHTN/early labor  Subjective: Comfortable with epidural  Objective: BP 138/84   Pulse 96   Temp 98.5 F (36.9 C) (Oral)   Resp 15   Ht 5\' 3"  (1.6 m)   Wt 111.1 kg   SpO2 97%   BMI 43.40 kg/m  or  Vitals:   09/05/18 0900 09/05/18 0930 09/05/18 1000 09/05/18 1030  BP: (!) 148/75 (!) 141/84 123/78 138/84  Pulse: 85 93 (!) 116 96  Resp: 15 15 15 15   Temp:   98.5 F (36.9 C)   TempSrc:   Oral   SpO2:      Weight:      Height:        Dilation: 5.5 Effacement (%): 60 Cervical Position: Posterior Station: -2 Presentation: Vertex Exam by:: Dr Harl Favor FHT: baseline rate 150s, moderate varibility, + acel, variable decel Toco: reg contractions q3-66m  Labs: Lab Results  Component Value Date   WBC 7.6 09/05/2018   HGB 10.6 (L) 09/05/2018   HCT 31.7 (L) 09/05/2018   MCV 86.8 09/05/2018   PLT 181 09/05/2018    Patient Active Problem List   Diagnosis Date Noted  . Encounter for induction of labor 09/04/2018  . Morbid obesity (HCC) 09/04/2018  . Limited prenatal care 09/04/2018  . Hypertension affecting pregnancy in third trimester 09/20/2014  . Hx of intrauterine fetal death, currently pregnant     Assessment / Plan: 28 y.o. [id number] at [redacted]w[redacted]d here for IOL gHTN/early labor  Labor: progressing. AROM now and IUPC placed for titration of pitocin. Baby feels asynclitic. Will reposition to assist with descent with good presentation.  Fetal Wellbeing:  Cat 2 (reassuring) Pain Control:  Epidural in place Anticipated MOD:  SVD  Gwenevere Abbot, MD  OB Fellow  09/05/2018, 10:58 AM

## 2018-09-05 NOTE — Anesthesia Preprocedure Evaluation (Signed)
Anesthesia Evaluation  Patient identified by MRN, date of birth, ID band Patient awake    Reviewed: Allergy & Precautions, H&P , NPO status , Patient's Chart, lab work & pertinent test results  History of Anesthesia Complications Negative for: history of anesthetic complications  Airway Mallampati: II  TM Distance: >3 FB Neck ROM: full    Dental no notable dental hx. (+) Teeth Intact   Pulmonary neg pulmonary ROS,    Pulmonary exam normal breath sounds clear to auscultation       Cardiovascular hypertension, negative cardio ROS Normal cardiovascular exam Rhythm:regular Rate:Normal     Neuro/Psych negative neurological ROS  negative psych ROS   GI/Hepatic negative GI ROS, Neg liver ROS,   Endo/Other  Morbid obesity  Renal/GU negative Renal ROS  negative genitourinary   Musculoskeletal   Abdominal   Peds  Hematology negative hematology ROS (+)   Anesthesia Other Findings   Reproductive/Obstetrics (+) Pregnancy                             Anesthesia Physical Anesthesia Plan  ASA: III  Anesthesia Plan: Epidural   Post-op Pain Management:    Induction:   PONV Risk Score and Plan:   Airway Management Planned:   Additional Equipment:   Intra-op Plan:   Post-operative Plan:   Informed Consent: I have reviewed the patients History and Physical, chart, labs and discussed the procedure including the risks, benefits and alternatives for the proposed anesthesia with the patient or authorized representative who has indicated his/her understanding and acceptance.       Plan Discussed with:   Anesthesia Plan Comments:         Anesthesia Quick Evaluation  

## 2018-09-05 NOTE — Discharge Summary (Addendum)
Postpartum Discharge Summary     Patient Name: Kimberly [name] DOB: 25-Apr-1991 MRN: [id number]  Date of admission: 09/04/2018 Delivering Provider: Gwenevere Abbot   Date of discharge: 09/07/2018  Admitting diagnosis: pressure Intrauterine pregnancy: [redacted]w[redacted]d     Secondary diagnosis:  Principal Problem:   Hypertension affecting pregnancy in third trimester Active Problems:   Hx of intrauterine fetal death, currently pregnant   Encounter for induction of labor   Morbid obesity (HCC)   Limited prenatal care   Gestational hypertension without significant proteinuria  Additional problems: None     Discharge diagnosis: Term Pregnancy Delivered, VBAC and Gestational Hypertension                                                                                                Post partum procedures:Depo given prior to discharge  Augmentation: AROM, Pitocin and Foley Balloon  Complications: None  Hospital course:  Induction of Labor With Vaginal Delivery   28 y.o. yo [id number] at [redacted]w[redacted]d was admitted to the hospital 09/04/2018 for induction of labor.  Indication for induction: Gestational hypertension.  Patient had an uncomplicated labor course as follows: Membrane Rupture Time/Date: 10:52 AM ,09/05/2018   Intrapartum Procedures: Episiotomy: None [1]                                         Lacerations:  None [1]  Patient had delivery of a Viable infant.  Information for the patient's newborn:  Altamease, Cambron [[id number]]  Delivery Method: VBAC   09/05/2018  Details of delivery can be found in separate delivery note.  Patient had a routine postpartum course. BP was elevated prior to discharge. Patient was started on Norvasc 5 mg prior to discharge. Baby love order placed and patient has a 1 week BP check scheduled in the office. Patient is discharged home 09/07/18.  Magnesium Sulfate recieved: No BMZ received: No  Physical exam  Vitals:   09/06/18 0502 09/06/18 1401 09/06/18 2136  09/07/18 0520  BP: (!) 107/95 123/72 (!) 141/87 136/75  Pulse: 66 81 78 89  Resp: 16 16 18 16   Temp: 97.7 F (36.5 C) (!) 97.5 F (36.4 C) 97.6 F (36.4 C) 98.6 F (37 C)  TempSrc: Oral Oral Oral Oral  SpO2: 99% 99%    Weight:      Height:       General: alert, cooperative and no distress Lochia: appropriate Uterine Fundus: firm Incision: N/A DVT Evaluation: No evidence of DVT seen on physical exam. Labs: Lab Results  Component Value Date   WBC 7.6 09/05/2018   HGB 10.6 (L) 09/05/2018   HCT 31.7 (L) 09/05/2018   MCV 86.8 09/05/2018   PLT 181 09/05/2018   CMP Latest Ref Rng & Units 09/04/2018  Glucose 70 - 99 mg/dL 71  BUN 6 - 20 mg/dL 6  Creatinine 2.50 - 5.39 mg/dL 7.67  Sodium 341 - 937 mmol/L 136  Potassium 3.5 - 5.1 mmol/L 3.4(L)  Chloride 98 - 111 mmol/L 106  CO2 22 - 32 mmol/L 20(L)  Calcium 8.9 - 10.3 mg/dL 8.1(M)  Total Protein 6.5 - 8.1 g/dL 7.1  Total Bilirubin 0.3 - 1.2 mg/dL 0.5  Alkaline Phos 38 - 126 U/L 144(H)  AST 15 - 41 U/L 20  ALT 0 - 44 U/L 14    Discharge instruction: per After Visit Summary and "Baby and Me Booklet".  After visit meds:  Allergies as of 09/07/2018   No Known Allergies     Medication List    TAKE these medications   amLODipine 5 MG tablet Commonly known as:  NORVASC Take 1 tablet (5 mg total) by mouth daily.   ibuprofen 600 MG tablet Commonly known as:  ADVIL,MOTRIN Take 1 tablet (600 mg total) by mouth every 6 (six) hours.   medroxyPROGESTERone 150 MG/ML injection Commonly known as:  DEPO-PROVERA Inject 1 mL (150 mg total) into the muscle once for 1 dose.   senna-docusate 8.6-50 MG tablet Commonly known as:  Senokot-S Take 2 tablets by mouth daily. Start taking on:  September 08, 2018       Diet: low salt diet  Activity: Advance as tolerated. Pelvic rest for 6 weeks.   Outpatient follow up:4 weeks Follow up Appt: Future Appointments  Date Time Provider Department Center  09/12/2018  2:30 PM [facility]  NURSE [facility]  10/04/2018  9:35 AM Rasch, Harolyn Rutherford, NP [facility]   Follow up Visit: Follow-up Information    [facility]. Go on 09/12/2018.   Specialty:  Obstetrics and Gynecology Why:  For blood pressure check at 2:30 pm  Contact information: [address] [place] [address] [phone]         Please schedule this patient for Postpartum visit in: 4 weeks with the following provider: Any provider For C/S patients schedule nurse incision check in weeks 2 weeks: no High risk pregnancy complicated by: HTN Delivery mode:  SVD Anticipated Birth Control:  Depo - given prior to discharge PP Procedures needed: BP check in 1 week Schedule Integrated BH visit: no  Newborn Data: Live born female  Birth Weight:  2863g APGAR: 8, 9  Newborn Delivery   Birth date/time:  09/05/2018 11:57:00 Delivery type:  VBAC, Spontaneous     Baby Feeding: Bottle Disposition:home with mother   09/07/2018 Joana Reamer, DO   OB FELLOW DISCHARGE ATTESTATION  I have seen and examined this patient and agree with above documentation in the resident's note.   Marcy Siren, D.O. OB Fellow  09/07/2018, 8:42 PM

## 2018-09-05 NOTE — Progress Notes (Signed)
Kimberly [name] is a 28 y.o. [id number] at [redacted]w[redacted]d.  Subjective: Patient is resting in bed comfortably with no concerns or complaints. Epidural has been placed. Foley bulb is out.  Objective: BP 129/70   Pulse 96   Temp 98.8 F (37.1 C) (Oral)   Resp (P) 18   Ht 5\' 3"  (1.6 m)   Wt 111.1 kg   SpO2 98%   BMI 43.40 kg/m    Dilation: 4.5 Effacement (%): 70 Cervical Position: Posterior Station: Ballotable Presentation: Vertex Exam by:: Casey Burkitt, RN  Labs: Results for orders placed or performed during the hospital encounter of 09/04/18 (from the past 24 hour(s))  Urinalysis, Routine w reflex microscopic     Status: Abnormal   Collection Time: 09/04/18  6:17 PM  Result Value Ref Range   Color, Urine YELLOW YELLOW   APPearance CLEAR CLEAR   Specific Gravity, Urine 1.025 1.005 - 1.030   pH 7.0 5.0 - 8.0   Glucose, UA NEGATIVE NEGATIVE mg/dL   Hgb urine dipstick NEGATIVE NEGATIVE   Bilirubin Urine NEGATIVE NEGATIVE   Ketones, ur >80 (A) NEGATIVE mg/dL   Protein, ur NEGATIVE NEGATIVE mg/dL   Nitrite NEGATIVE NEGATIVE   Leukocytes,Ua TRACE (A) NEGATIVE  Protein / creatinine ratio, urine     Status: None   Collection Time: 09/04/18  6:17 PM  Result Value Ref Range   Creatinine, Urine 311.00 mg/dL   Total Protein, Urine 12 mg/dL   Protein Creatinine Ratio 0.04 0.00 - 0.15 mg/mg[Cre]  Rapid urine drug screen (hospital performed)     Status: None   Collection Time: 09/04/18  6:17 PM  Result Value Ref Range   Opiates NONE DETECTED NONE DETECTED   Cocaine NONE DETECTED NONE DETECTED   Benzodiazepines NONE DETECTED NONE DETECTED   Amphetamines NONE DETECTED NONE DETECTED   Tetrahydrocannabinol NONE DETECTED NONE DETECTED   Barbiturates NONE DETECTED NONE DETECTED  Urinalysis, Microscopic (reflex)     Status: Abnormal   Collection Time: 09/04/18  6:17 PM  Result Value Ref Range   RBC / HPF NONE SEEN 0 - 5 RBC/hpf   WBC, UA 0-5 0 - 5 WBC/hpf   Bacteria, UA FEW (A) NONE SEEN   Squamous Epithelial / LPF 0-5 0 - 5   Mucus PRESENT   CBC     Status: Abnormal   Collection Time: 09/04/18  6:27 PM  Result Value Ref Range   WBC 7.3 4.0 - 10.5 K/uL   RBC 3.88 3.87 - 5.11 MIL/uL   Hemoglobin 11.2 (L) 12.0 - 15.0 g/dL   HCT 76.1 (L) 95.0 - 93.2 %   MCV 86.9 80.0 - 100.0 fL   MCH 28.9 26.0 - 34.0 pg   MCHC 33.2 30.0 - 36.0 g/dL   RDW 67.1 24.5 - 80.9 %   Platelets 193 150 - 400 K/uL   nRBC 0.0 0.0 - 0.2 %  Comprehensive metabolic panel     Status: Abnormal   Collection Time: 09/04/18  6:27 PM  Result Value Ref Range   Sodium 136 135 - 145 mmol/L   Potassium 3.4 (L) 3.5 - 5.1 mmol/L   Chloride 106 98 - 111 mmol/L   CO2 20 (L) 22 - 32 mmol/L   Glucose, Bld 71 70 - 99 mg/dL   BUN 6 6 - 20 mg/dL   Creatinine, Ser 9.83 0.44 - 1.00 mg/dL   Calcium 8.8 (L) 8.9 - 10.3 mg/dL   Total Protein 7.1 6.5 - 8.1 g/dL   Albumin 3.4 (  L) 3.5 - 5.0 g/dL   AST 20 15 - 41 U/L   ALT 14 0 - 44 U/L   Alkaline Phosphatase 144 (H) 38 - 126 U/L   Total Bilirubin 0.5 0.3 - 1.2 mg/dL   GFR calc non Af Amer >60 >60 mL/min   GFR calc Af Amer >60 >60 mL/min   Anion gap 10 5 - 15  Type and screen [facility]     Status: None   Collection Time: 09/04/18  9:26 PM  Result Value Ref Range   ABO/RH(D) B POS    Antibody Screen NEG    Sample Expiration      09/07/2018 Performed at [facility], [address] [place], [place] [address]   CBC with Differential/Platelet     Status: Abnormal   Collection Time: 09/05/18  4:43 AM  Result Value Ref Range   WBC 7.6 4.0 - 10.5 K/uL   RBC 3.65 (L) 3.87 - 5.11 MIL/uL   Hemoglobin 10.6 (L) 12.0 - 15.0 g/dL   HCT 30.9 (L) 40.7 - 68.0 %   MCV 86.8 80.0 - 100.0 fL   MCH 29.0 26.0 - 34.0 pg   MCHC 33.4 30.0 - 36.0 g/dL   RDW 88.1 10.3 - 15.9 %   Platelets 181 150 - 400 K/uL   nRBC 0.0 0.0 - 0.2 %   Neutrophils Relative % 76 %   Neutro Abs 5.8 1.7 - 7.7 K/uL   Lymphocytes Relative 17 %   Lymphs Abs 1.3 0.7 - 4.0 K/uL    Monocytes Relative 7 %   Monocytes Absolute 0.5 0.1 - 1.0 K/uL   Eosinophils Relative 0 %   Eosinophils Absolute 0.0 0.0 - 0.5 K/uL   Basophils Relative 0 %   Basophils Absolute 0.0 0.0 - 0.1 K/uL    Assessment / Plan: [redacted]w[redacted]d week IUP admitted for IOL due to elevated BP at term Labor: Foley bulb out, progressing on pitocin Fetal Wellbeing:  Category 1 Pain Control:  Epidural in place Anticipated MOD:  TOLAC  Arlyce Harman, DO  [facility], PGY-2 09/05/2018 5:30 AM

## 2018-09-06 LAB — HEPATITIS B SURFACE ANTIGEN: Hepatitis B Surface Ag: NEGATIVE

## 2018-09-06 LAB — GC/CHLAMYDIA PROBE AMP (~~LOC~~) NOT AT ARMC
Chlamydia: NEGATIVE
Neisseria Gonorrhea: NEGATIVE

## 2018-09-06 NOTE — Clinical Social Work Maternal (Signed)
CLINICAL SOCIAL WORK MATERNAL/CHILD NOTE  Patient Details  Name: Kimberly [name] MRN: [id number] Date of Birth: 1990-08-06  Date:  09/06/2018  Clinical Social Worker Initiating Note:  Laurey Arrow Date/Time: Initiated:  09/06/18/1148     Child's Name:  Kimberly [name]   Biological Parents:  Mother, Father(FOB is Kimberly [name] 11/01/1992.)   Need for Interpreter:  None   Reason for Referral:  Late or No Prenatal Care    Address:  [place] [address]    Phone number:  [phone] (home)     Additional phone number:   Household Members/Support Persons (HM/SP):   Household Member/Support Person 1, Household Member/Support Person 2   HM/SP Name Relationship DOB or Age  HM/SP -1 Kimberly [name] daughter 09/21/2014  HM/SP -2 Kimberly [name] son 05/07/2017  HM/SP -3        HM/SP -4        HM/SP -5        HM/SP -6        HM/SP -7        HM/SP -8          Natural Supports (not living in the home):  Spouse/significant other, Friends   Chiropodist: None   Employment: Unemployed   Type of Work:     Education:  Programmer, systems   Homebound arranged:    Museum/gallery curator Resources:  [employer]   Other Resources:  [employer], Physicist, medical , [employer]    Cultural/Religious Considerations Which May Impact Care:  Per [employer], [name] is Engineer, manufacturing.   Strengths:  Ability to meet basic needs , Home prepared for child (Per [name], [name] plans to obtain a car seat and bassinet prior to infant's discharge.  CSW enouraged [name] to contact CSW if [name] is unable to obtain essential items. )   Psychotropic Medications:         Pediatrician:       Pediatrician List:   [facility]      Pediatrician Fax Number:    Risk Factors/Current Problems:  Transportation (CSW provide [name] with information to apply for [employer]. )   Cognitive State:  Able to Concentrate  , Alert , Linear Thinking    Mood/Affect:  Interested , Happy , Relaxed , Comfortable , Calm    CSW Assessment: CSW met with [name] in room 122 to complete an assessment for no PNC.  [name] was polite, forthcoming, and receptive to meeting with CSW.   CSW asked about lack of PNC and [name] stated, "It was hard to get someone to keep my toddlers and I don't like taking them to appointments with me because it is too much."  CSW assessed for barriers for follow-up appointments for infant and [name] acknowledged not having reliable transportation.  CSW provided [name] with information to apply for Medicaid Transportation and [name] expressed an  interest.   CSW made [name] aware of hospital's no PNC policy and [name] was understanding.  [name] stated, "I'm familiar with the process because it happened with my last 2 pregnancies." [name] denied the use of all illicit substances and did not appeared to be bothered regarding drug screens.  [name] also denied CPS hx. [name] was informed that CSW will continue to monitor infant's UDS and CDS and will make a report to [place] if warranted.  [name] reported having a good support team and feeling prepared to parent.   CSW Plan/Description:  No Further Intervention Required/No Barriers to Discharge, Perinatal Mood and Anxiety Disorder (PMADs) Education, Other Patient/Family Education, [place], Other Information/Referral to [employer], CSW Will Continue to Monitor Umbilical Cord Tissue Drug Screen Results and Make Report if Warranted   Laurey Arrow, MSW, LCSW Clinical Social Work [phone]   Dimple Nanas, [place] 09/06/2018, 12:08 PM

## 2018-09-06 NOTE — Progress Notes (Signed)
Post Partum Day 1 Subjective: no complaints, up ad lib, voiding and tolerating PO; bottle; plans depo for contraception  Objective: Blood pressure (!) 107/95, pulse 66, temperature 97.7 F (36.5 C), temperature source Oral, resp. rate 16, height 5\' 3"  (1.6 m), weight 111.1 kg, SpO2 99 %, unknown if currently breastfeeding.  Physical Exam:  General: alert, cooperative and no distress Lochia: appropriate Uterine Fundus: firm DVT Evaluation: No evidence of DVT seen on physical exam.  Recent Labs    09/04/18 1827 09/05/18 0443  HGB 11.2* 10.6*  HCT 33.7* 31.7*    Assessment/Plan: Plan for discharge tomorrow Contraception: plans depo   LOS: 2 days   Brand Males 09/06/2018, 7:55 AM

## 2018-09-07 MED ORDER — IBUPROFEN 600 MG PO TABS: 600.0000 mg | ORAL_TABLET | Freq: Four times a day (QID) | ORAL | 1 refills | Status: DC

## 2018-09-07 MED ORDER — AMLODIPINE BESYLATE 5 MG PO TABS: 5.0000 mg | ORAL_TABLET | Freq: Every day | ORAL | 0 refills | Status: DC

## 2018-09-07 MED ORDER — AMLODIPINE BESYLATE 5 MG PO TABS
5.0000 mg | ORAL_TABLET | Freq: Every day | ORAL | Status: DC
  Administered 2018-09-07: 5 mg via ORAL
  Filled 2018-09-07 (×3): qty 1

## 2018-09-07 MED ORDER — SENNOSIDES-DOCUSATE SODIUM 8.6-50 MG PO TABS: 2.0000 | ORAL_TABLET | ORAL | 0 refills | Status: DC

## 2018-09-07 MED ORDER — MEDROXYPROGESTERONE ACETATE 150 MG/ML IM SUSP
150.0000 mg | Freq: Once | INTRAMUSCULAR | Status: AC
  Administered 2018-09-07: 150 mg via INTRAMUSCULAR
  Filled 2018-09-07: qty 1

## 2018-09-07 MED ORDER — MEDROXYPROGESTERONE ACETATE 150 MG/ML IM SUSP: 150.0000 mg | Freq: Once | INTRAMUSCULAR | 0 refills | Status: DC

## 2018-09-07 NOTE — Progress Notes (Signed)
CSW notified by [name]'s RN that [name] needs car seat for infant.   CSW met with [name] at bedside and discussed car seat needs. [name] confirmed that she has never used car seat program before and is having financial hardships and unable to afford a car seat. [name] reported that she has $30 to pay for car seat. CSW agreed to notify house coverage.  CSW notified house coverage that [name] needed a car seat. House coverage to follow up.  Kimberly Calk, LCSW Clinical Social Worker [facility] Cell#: [phone]   

## 2018-09-07 NOTE — Discharge Instructions (Signed)

## 2018-09-12 ENCOUNTER — Ambulatory Visit: Payer: Self-pay

## 2018-10-03 ENCOUNTER — Telehealth: Payer: Self-pay

## 2018-10-03 NOTE — Telephone Encounter (Signed)
Called patient for appointment information for 10/03/2018 Pt verbalized understanding and asked to be reschedule for late feb, states she got her depo at the hospital.

## 2018-10-04 ENCOUNTER — Ambulatory Visit: Payer: Self-pay | Admitting: Obstetrics and Gynecology

## 2018-11-05 ENCOUNTER — Ambulatory Visit: Payer: Self-pay | Admitting: Advanced Practice Midwife

## 2018-11-07 ENCOUNTER — Encounter: Payer: Self-pay | Admitting: *Deleted

## 2018-11-07 ENCOUNTER — Ambulatory Visit (INDEPENDENT_AMBULATORY_CARE_PROVIDER_SITE_OTHER): Payer: Medicaid Other | Admitting: Obstetrics & Gynecology

## 2018-11-07 ENCOUNTER — Other Ambulatory Visit: Payer: Self-pay

## 2018-11-07 ENCOUNTER — Encounter: Payer: Self-pay | Admitting: Obstetrics & Gynecology

## 2018-11-07 ENCOUNTER — Telehealth: Payer: Self-pay | Admitting: Obstetrics & Gynecology

## 2018-11-07 DIAGNOSIS — O0933 Supervision of pregnancy with insufficient antenatal care, third trimester: Secondary | ICD-10-CM

## 2018-11-07 NOTE — Progress Notes (Signed)
Subjective:     Kimberly [name] is a single P6 (9, 4, 2, 47, and 39 month old kids) who presents for a postpartum visit. She is 8 weeks postpartum following a  VBAC, no prenatal care.  I have fully reviewed the prenatal and intrapartum course. The delivery was at 40 gestational weeks. Outcome: NSVD  Anesthesia: epidural. Postpartum course has been normal. Baby's course has been normal. Baby is feeding by bottle. Bleeding: spotting. Bowel function is normal Bladder function is normal Patient is sexually active. Contraception method is depo provera. She wants a BTL. Postpartum depression screening: negative. She is not taking any BP meds now.   Review of Systems  Last pap 2/16, normal, she denies any h/o abnormal paps Homemaker  Objective:    There were no vitals taken for this visit.    Plan:    1. Contraception: depo provera until BTL, come in for depo shot in 4 weeks 2. I will send Rhea Bleacher a message to schedule a BTL

## 2018-11-07 NOTE — Progress Notes (Deleted)
Subjective:     Kimberly [name] is a 28 y.o. female who presents for a postpartum visit. She is {1-10:13787} {time; units:18646} postpartum following a {delivery:12449}. I have fully reviewed the prenatal and intrapartum course. The delivery was at *** gestational weeks. Outcome: {delivery outcome:32078}. Anesthesia: {anesthesia types:812}. Postpartum course has been ***. Baby's course has been ***. Baby is feeding by {breast/bottle:69}. Bleeding {vag bleed:12292}. Bowel function is {normal:32111}. Bladder function is {normal:32111}. Patient {is/is not:9024} sexually active. Contraception method is {contraceptive method:5051}. Postpartum depression screening: {neg default:13464::"negative"}.  {Common ambulatory SmartLinks:19316}  Review of Systems {ros; complete:30496}   Objective:    There were no vitals taken for this visit.  General:  {gen appearance:16600}   Breasts:  {breast exam:1202::"inspection negative, no nipple discharge or bleeding, no masses or nodularity palpable"}  Lungs: {lung exam:16931}  Heart:  {heart exam:5510}  Abdomen: {abdomen exam:16834}   Vulva:  {labia exam:12198}  Vagina: {vagina exam:12200}  Cervix:  {cervix exam:14595}  Corpus: {uterus exam:12215}  Adnexa:  {adnexa exam:12223}  Rectal Exam: {rectal/vaginal exam:12274}        Assessment:    *** postpartum exam. Pap smear {done:10129} at today's visit.   Plan:    1. Contraception: {method:5051} 2. *** 3. Follow up in: {1-10:13787} {time; units:19136} or as needed.

## 2018-11-07 NOTE — Progress Notes (Signed)
Pt came by the office today to sign BTL papers.  Paperwork signed today.

## 2018-11-07 NOTE — Progress Notes (Signed)
   TELEHEALTH VIRTUAL GYNECOLOGY VISIT ENCOUNTER NOTE  I connected with [name] on 11/07/18 at  8:35 AM EDT by telephone at home and verified that I am speaking with the correct person using two identifiers.   I discussed the limitations, risks, security and privacy concerns of performing an evaluation and management service by telephone and the availability of in person appointments. I also discussed with the patient that there may be a patient responsible charge related to this service. The patient expressed understanding and agreed to proceed.        I provided of non-face-to-face time during this encounter.   Allie Bossier, MD Center for [employer], [facility]

## 2018-11-07 NOTE — Telephone Encounter (Signed)
Attempted to call patient to get her scheduled for her depo injection. No answer, left detailed message with appointment time and date (5/20 @ 9:00). Advised to give the office a call if needing to reschedule or has questions.

## 2018-12-05 ENCOUNTER — Ambulatory Visit: Payer: Medicaid Other

## 2018-12-25 ENCOUNTER — Encounter: Payer: Self-pay | Admitting: *Deleted

## 2019-01-09 ENCOUNTER — Telehealth: Payer: Self-pay | Admitting: Obstetrics & Gynecology

## 2019-01-09 NOTE — Telephone Encounter (Signed)
Patient was called and instructed about her visit for 01/10/2019. °

## 2019-01-10 ENCOUNTER — Telehealth: Payer: Self-pay | Admitting: Obstetrics & Gynecology

## 2019-01-10 ENCOUNTER — Telehealth (INDEPENDENT_AMBULATORY_CARE_PROVIDER_SITE_OTHER): Payer: Medicaid Other | Admitting: Obstetrics & Gynecology

## 2019-01-10 ENCOUNTER — Other Ambulatory Visit: Payer: Self-pay

## 2019-01-10 DIAGNOSIS — Z3009 Encounter for other general counseling and advice on contraception: Secondary | ICD-10-CM | POA: Diagnosis not present

## 2019-01-10 NOTE — Progress Notes (Signed)
    TELEHEALTH GYNECOLOGY VIRTUAL VIDEO VISIT ENCOUNTER NOTE  Provider location: Center for [employer] at [facility]   I connected with Kimberly [name] on 01/10/19 at  8:15 AM EDT by MyChart Video Encounter at home and verified that I am speaking with the correct person using two identifiers.   I discussed the limitations, risks, security and privacy concerns of performing an evaluation and management service by telephone and the availability of in person appointments. I also discussed with the patient that there may be a patient responsible charge related to this service. The patient expressed understanding and agreed to proceed.   History:  Kimberly [name] is a 28 y.o. [id number] female being evaluated today for a pre op visit. She is certain that she does not want more kids. She got a depo provera shot on 11/07/18. She signed her Medicaid forms on that day. She denies any abnormal vaginal discharge, bleeding, pelvic pain or other concerns.       Past Medical History:  Diagnosis Date  . Anxiety    because of last preg,scared something is wrong  . Chlamydia   . Constipation   . Depression    after loss of preg/child  . Gonorrhea   . Hypertension   . Pregnancy induced hypertension   . Vaginal Pap smear, abnormal    Past Surgical History:  Procedure Laterality Date  . CESAREAN SECTION N/A 03/20/2013   Procedure: CESAREAN SECTION;  Surgeon: Frederico Hamman, MD;  Location: [facility] [facility];  Service: Obstetrics;  Laterality: N/A;   The following portions of the patient's history were reviewed and updated as appropriate: allergies, current medications, past family history, past medical history, past social history, past surgical history and problem list.   Health Maintenance:  Normal pap and negative HRHPV on 2016  Review of Systems:  Pertinent items noted in HPI and remainder of comprehensive ROS otherwise negative.  Physical Exam:   General:  Alert, oriented and cooperative. Patient  appears to be in no acute distress.  Mental Status: Normal mood and affect. Normal behavior. Normal judgment and thought content.   Respiratory: Normal respiratory effort, no problems with respiration noted  Rest of physical exam deferred due to type of encounter  Labs and Imaging No results found for this or any previous visit (from the past 336 hour(s)). No results found.     Assessment and Plan:     Unwanted fertility- plan for laparoscopic application of Filsche clips. She is aware that this is a permanent procedure and that there is a failure rate as well as risks associated with surgery.      I discussed the assessment and treatment plan with the patient. The patient was provided an opportunity to ask questions and all were answered. The patient agreed with the plan and demonstrated an understanding of the instructions.   The patient was advised to call back or seek an in-person evaluation/go to the ED if the symptoms worsen or if the condition fails to improve as anticipated.  I provided 10 minutes of face-to-face time during this encounter.   Emily Filbert, MD Center for [employer], [place]

## 2019-01-10 NOTE — Telephone Encounter (Signed)
Called patient this morning because I noticed her MyChart was still blue. When I called her yesterday, she stated she had the APP. I was sure to express to her the importance of needing the MyChart App. She assured me she was downloading the app, and had it.

## 2019-01-23 ENCOUNTER — Encounter (HOSPITAL_BASED_OUTPATIENT_CLINIC_OR_DEPARTMENT_OTHER): Payer: Self-pay | Admitting: *Deleted

## 2019-01-23 ENCOUNTER — Other Ambulatory Visit: Payer: Self-pay

## 2019-01-25 ENCOUNTER — Encounter (HOSPITAL_BASED_OUTPATIENT_CLINIC_OR_DEPARTMENT_OTHER)
Admission: RE | Admit: 2019-01-25 | Discharge: 2019-01-25 | Disposition: A | Discharge status: Home / Self Care | Payer: Medicaid Other | Source: Ambulatory Visit | Attending: Obstetrics & Gynecology | Admitting: Obstetrics & Gynecology

## 2019-01-25 ENCOUNTER — Other Ambulatory Visit (HOSPITAL_COMMUNITY)
Admission: RE | Admit: 2019-01-25 | Discharge: 2019-01-25 | Disposition: A | Discharge status: Home / Self Care | Payer: Medicaid Other | Source: Ambulatory Visit | Attending: Obstetrics & Gynecology | Admitting: Obstetrics & Gynecology

## 2019-01-25 ENCOUNTER — Other Ambulatory Visit: Payer: Self-pay

## 2019-01-25 DIAGNOSIS — Z01812 Encounter for preprocedural laboratory examination: Secondary | ICD-10-CM | POA: Insufficient documentation

## 2019-01-25 DIAGNOSIS — Z1159 Encounter for screening for other viral diseases: Secondary | ICD-10-CM | POA: Diagnosis not present

## 2019-01-25 LAB — SARS CORONAVIRUS 2 (TAT 6-24 HRS): SARS Coronavirus 2: NEGATIVE

## 2019-01-25 LAB — POCT PREGNANCY, URINE: Preg Test, Ur: NEGATIVE

## 2019-01-25 NOTE — Progress Notes (Signed)
Ensure pre surgery drink given with instructions to complete by 0415 dos, pt verbalized understanding. 

## 2019-01-29 ENCOUNTER — Encounter (HOSPITAL_BASED_OUTPATIENT_CLINIC_OR_DEPARTMENT_OTHER)
Admission: RE | Disposition: A | Discharge status: Home / Self Care | Payer: Self-pay | Source: Home / Self Care | Attending: Obstetrics & Gynecology

## 2019-01-29 ENCOUNTER — Ambulatory Visit (HOSPITAL_BASED_OUTPATIENT_CLINIC_OR_DEPARTMENT_OTHER): Payer: Medicaid Other | Admitting: Anesthesiology

## 2019-01-29 ENCOUNTER — Ambulatory Visit (HOSPITAL_BASED_OUTPATIENT_CLINIC_OR_DEPARTMENT_OTHER)
Admission: RE | Admit: 2019-01-29 | Discharge: 2019-01-29 | Disposition: A | Discharge status: Home / Self Care | Payer: Medicaid Other | Attending: Obstetrics & Gynecology | Admitting: Obstetrics & Gynecology

## 2019-01-29 ENCOUNTER — Encounter (HOSPITAL_BASED_OUTPATIENT_CLINIC_OR_DEPARTMENT_OTHER): Payer: Self-pay | Admitting: *Deleted

## 2019-01-29 ENCOUNTER — Other Ambulatory Visit: Payer: Self-pay

## 2019-01-29 DIAGNOSIS — Z302 Encounter for sterilization: Secondary | ICD-10-CM | POA: Diagnosis present

## 2019-01-29 DIAGNOSIS — Z6841 Body Mass Index (BMI) 40.0 and over, adult: Secondary | ICD-10-CM | POA: Diagnosis not present

## 2019-01-29 DIAGNOSIS — Z793 Long term (current) use of hormonal contraceptives: Secondary | ICD-10-CM | POA: Diagnosis not present

## 2019-01-29 HISTORY — PX: LAPAROSCOPIC TUBAL LIGATION: SHX1937

## 2019-01-29 SURGERY — LIGATION, FALLOPIAN TUBE, LAPAROSCOPIC
Anesthesia: General | Laterality: Bilateral

## 2019-01-29 MED ORDER — PROPOFOL 10 MG/ML IV BOLUS
INTRAVENOUS | Status: DC | PRN
  Administered 2019-01-29: 250 mg via INTRAVENOUS

## 2019-01-29 MED ORDER — SUGAMMADEX SODIUM 500 MG/5ML IV SOLN
INTRAVENOUS | Status: AC
  Filled 2019-01-29: qty 5

## 2019-01-29 MED ORDER — ONDANSETRON HCL 4 MG/2ML IJ SOLN
INTRAMUSCULAR | Status: DC | PRN
  Administered 2019-01-29: 4 mg via INTRAVENOUS

## 2019-01-29 MED ORDER — FENTANYL CITRATE (PF) 100 MCG/2ML IJ SOLN
INTRAMUSCULAR | Status: AC
  Filled 2019-01-29: qty 2

## 2019-01-29 MED ORDER — FENTANYL CITRATE (PF) 100 MCG/2ML IJ SOLN
INTRAMUSCULAR | Status: DC | PRN
  Administered 2019-01-29 (×2): 50 ug via INTRAVENOUS

## 2019-01-29 MED ORDER — IBUPROFEN 600 MG PO TABS: 600.0000 mg | ORAL_TABLET | Freq: Four times a day (QID) | ORAL | 1 refills | Status: DC | PRN

## 2019-01-29 MED ORDER — OXYCODONE HCL 5 MG PO TABS
5.0000 mg | ORAL_TABLET | Freq: Once | ORAL | Status: AC | PRN
  Administered 2019-01-29: 5 mg via ORAL

## 2019-01-29 MED ORDER — MIDAZOLAM HCL 2 MG/2ML IJ SOLN: 1.0000 mg | INTRAMUSCULAR | Status: DC | PRN

## 2019-01-29 MED ORDER — LACTATED RINGERS IV SOLN
INTRAVENOUS | Status: DC
  Administered 2019-01-29 (×2): via INTRAVENOUS

## 2019-01-29 MED ORDER — OXYCODONE HCL 5 MG PO TABS
ORAL_TABLET | ORAL | Status: AC
  Filled 2019-01-29: qty 1

## 2019-01-29 MED ORDER — ROCURONIUM BROMIDE 50 MG/5ML IV SOSY
PREFILLED_SYRINGE | INTRAVENOUS | Status: DC | PRN
  Administered 2019-01-29: 35 mg via INTRAVENOUS
  Administered 2019-01-29: 5 mg via INTRAVENOUS

## 2019-01-29 MED ORDER — ACETAMINOPHEN 500 MG PO TABS
ORAL_TABLET | ORAL | Status: AC
  Filled 2019-01-29: qty 2

## 2019-01-29 MED ORDER — LIDOCAINE 2% (20 MG/ML) 5 ML SYRINGE
INTRAMUSCULAR | Status: DC | PRN
  Administered 2019-01-29: 100 mg via INTRAVENOUS

## 2019-01-29 MED ORDER — SUCCINYLCHOLINE CHLORIDE 200 MG/10ML IV SOSY
PREFILLED_SYRINGE | INTRAVENOUS | Status: DC | PRN
  Administered 2019-01-29: 140 mg via INTRAVENOUS

## 2019-01-29 MED ORDER — SCOPOLAMINE 1 MG/3DAYS TD PT72: 1.0000 | MEDICATED_PATCH | Freq: Once | TRANSDERMAL | Status: DC

## 2019-01-29 MED ORDER — ACETAMINOPHEN 500 MG PO TABS
1000.0000 mg | ORAL_TABLET | Freq: Once | ORAL | Status: AC
  Administered 2019-01-29: 1000 mg via ORAL

## 2019-01-29 MED ORDER — DEXAMETHASONE SODIUM PHOSPHATE 10 MG/ML IJ SOLN
INTRAMUSCULAR | Status: DC | PRN
  Administered 2019-01-29: 10 mg via INTRAVENOUS

## 2019-01-29 MED ORDER — FENTANYL CITRATE (PF) 100 MCG/2ML IJ SOLN: 50.0000 ug | INTRAMUSCULAR | Status: DC | PRN

## 2019-01-29 MED ORDER — ROCURONIUM BROMIDE 10 MG/ML (PF) SYRINGE
PREFILLED_SYRINGE | INTRAVENOUS | Status: AC
  Filled 2019-01-29: qty 10

## 2019-01-29 MED ORDER — BUPIVACAINE HCL (PF) 0.5 % IJ SOLN
INTRAMUSCULAR | Status: DC | PRN
  Administered 2019-01-29: 10 mL

## 2019-01-29 MED ORDER — CEFAZOLIN SODIUM-DEXTROSE 2-4 GM/100ML-% IV SOLN
2.0000 g | INTRAVENOUS | Status: AC
  Administered 2019-01-29: 2 g via INTRAVENOUS

## 2019-01-29 MED ORDER — LIDOCAINE 2% (20 MG/ML) 5 ML SYRINGE
INTRAMUSCULAR | Status: AC
  Filled 2019-01-29: qty 5

## 2019-01-29 MED ORDER — OXYCODONE-ACETAMINOPHEN 5-325 MG PO TABS: 1.0000 | ORAL_TABLET | Freq: Four times a day (QID) | ORAL | 0 refills | Status: DC | PRN

## 2019-01-29 MED ORDER — FENTANYL CITRATE (PF) 100 MCG/2ML IJ SOLN
25.0000 ug | INTRAMUSCULAR | Status: DC | PRN
  Administered 2019-01-29: 50 ug via INTRAVENOUS

## 2019-01-29 MED ORDER — CEFAZOLIN SODIUM-DEXTROSE 2-4 GM/100ML-% IV SOLN
INTRAVENOUS | Status: AC
  Filled 2019-01-29: qty 100

## 2019-01-29 MED ORDER — DEXAMETHASONE SODIUM PHOSPHATE 10 MG/ML IJ SOLN
INTRAMUSCULAR | Status: AC
  Filled 2019-01-29: qty 1

## 2019-01-29 MED ORDER — SUGAMMADEX SODIUM 200 MG/2ML IV SOLN
INTRAVENOUS | Status: DC | PRN
  Administered 2019-01-29: 350 mg via INTRAVENOUS

## 2019-01-29 MED ORDER — SUCCINYLCHOLINE CHLORIDE 200 MG/10ML IV SOSY
PREFILLED_SYRINGE | INTRAVENOUS | Status: AC
  Filled 2019-01-29: qty 10

## 2019-01-29 MED ORDER — ONDANSETRON HCL 4 MG/2ML IJ SOLN
INTRAMUSCULAR | Status: AC
  Filled 2019-01-29: qty 2

## 2019-01-29 MED ORDER — PROPOFOL 500 MG/50ML IV EMUL
INTRAVENOUS | Status: AC
  Filled 2019-01-29: qty 50

## 2019-01-29 MED ORDER — MIDAZOLAM HCL 2 MG/2ML IJ SOLN
INTRAMUSCULAR | Status: DC | PRN
  Administered 2019-01-29: 2 mg via INTRAVENOUS

## 2019-01-29 MED ORDER — MIDAZOLAM HCL 2 MG/2ML IJ SOLN
INTRAMUSCULAR | Status: AC
  Filled 2019-01-29: qty 2

## 2019-01-29 SURGICAL SUPPLY — 31 items
BNDG ADH 1X3 SHEER STRL LF (GAUZE/BANDAGES/DRESSINGS) ×1 IMPLANT
BRIEF STRETCH FOR OB PAD XXL (UNDERPADS AND DIAPERS) ×3 IMPLANT
CLIP FILSHIE TUBAL LIGA STRL (Clip) ×4 IMPLANT
DRSG OPSITE POSTOP 3X4 (GAUZE/BANDAGES/DRESSINGS) ×2 IMPLANT
DURAPREP 26ML APPLICATOR (WOUND CARE) ×3 IMPLANT
GLOVE BIO SURGEON STRL SZ 6.5 (GLOVE) ×4 IMPLANT
GLOVE BIO SURGEONS STRL SZ 6.5 (GLOVE) ×2
GLOVE BIOGEL PI IND STRL 7.0 (GLOVE) ×2 IMPLANT
GLOVE BIOGEL PI INDICATOR 7.0 (GLOVE) ×4
GOWN STRL REUS W/ TWL XL LVL3 (GOWN DISPOSABLE) ×1 IMPLANT
GOWN STRL REUS W/TWL LRG LVL3 (GOWN DISPOSABLE) ×3 IMPLANT
GOWN STRL REUS W/TWL XL LVL3 (GOWN DISPOSABLE) ×2
NEEDLE INSUFFLATION 120MM (ENDOMECHANICALS) ×3 IMPLANT
NS IRRIG 1000ML POUR BTL (IV SOLUTION) ×1 IMPLANT
PACK LAPAROSCOPY BASIN (CUSTOM PROCEDURE TRAY) ×3 IMPLANT
PACK TRENDGUARD 450 HYBRID PRO (MISCELLANEOUS) IMPLANT
PACK TRENDGUARD 600 HYBRD PROC (MISCELLANEOUS) IMPLANT
PAD OB MATERNITY 4.3X12.25 (PERSONAL CARE ITEMS) ×3 IMPLANT
PAD PREP 24X48 CUFFED NSTRL (MISCELLANEOUS) ×3 IMPLANT
SET TUBE SMOKE EVAC HIGH FLOW (TUBING) ×3 IMPLANT
SHEARS HARMONIC ACE PLUS 36CM (ENDOMECHANICALS) IMPLANT
SLEEVE SCD COMPRESS KNEE MED (MISCELLANEOUS) ×3 IMPLANT
SLEEVE XCEL OPT CAN 5 100 (ENDOMECHANICALS) IMPLANT
SUT VICRYL 0 UR6 27IN ABS (SUTURE) ×3 IMPLANT
SUT VICRYL 4-0 PS2 18IN ABS (SUTURE) ×3 IMPLANT
TOWEL GREEN STERILE FF (TOWEL DISPOSABLE) ×4 IMPLANT
TRENDGUARD 450 HYBRID PRO PACK (MISCELLANEOUS) ×3
TRENDGUARD 600 HYBRID PROC PK (MISCELLANEOUS)
TROCAR OPTI TIP 5M 100M (ENDOMECHANICALS) ×2 IMPLANT
TROCAR XCEL DIL TIP R 11M (ENDOMECHANICALS) ×3 IMPLANT
WARMER LAPAROSCOPE (MISCELLANEOUS) ×3 IMPLANT

## 2019-01-29 NOTE — Anesthesia Procedure Notes (Addendum)
Procedure Name: Intubation Date/Time: 01/29/2019 7:36 AM Performed by: Genelle Bal, CRNA Pre-anesthesia Checklist: Patient identified, Emergency Drugs available, Suction available and Patient being monitored Patient Re-evaluated:Patient Re-evaluated prior to induction Oxygen Delivery Method: Circle system utilized Preoxygenation: Pre-oxygenation with 100% oxygen Induction Type: IV induction Ventilation: Mask ventilation without difficulty Laryngoscope Size: Miller and 2 Grade View: Grade I Tube type: Oral Tube size: 7.0 mm Number of attempts: 1 Airway Equipment and Method: Stylet and Oral airway Placement Confirmation: ETT inserted through vocal cords under direct vision,  positive ETCO2 and breath sounds checked- equal and bilateral Secured at: 22 cm Tube secured with: Tape Dental Injury: Teeth and Oropharynx as per pre-operative assessment

## 2019-01-29 NOTE — Discharge Instructions (Signed)
NO TYLENOL BEFORE 1 PM TODAY! 5mg  Oxycodone taken at 9:55am   Laparoscopic Tubal Ligation, Care After This sheet gives you information about how to care for yourself after your procedure. Your health care provider may also give you more specific instructions. If you have problems or questions, contact your health care provider. What can I expect after the procedure? After the procedure, it is common to have:  A sore throat.  Discomfort in your shoulder.  Mild discomfort or cramping in your abdomen.  Gas pains.  Pain or soreness in the area where the surgical incision was made.  A bloated feeling.  Tiredness.  Nausea.  Vomiting. Follow these instructions at home: Medicines  Take over-the-counter and prescription medicines only as told by your health care provider.  Do not take aspirin because it can cause bleeding.  Ask your health care provider if the medicine prescribed to you: ? Requires you to avoid driving or using heavy machinery. ? Can cause constipation. You may need to take actions to prevent or treat constipation, such as:  Drink enough fluid to keep your urine pale yellow.  Take over-the-counter or prescription medicines.  Eat foods that are high in fiber, such as beans, whole grains, and fresh fruits and vegetables.  Limit foods that are high in fat and processed sugars, such as fried or sweet foods. Incision care      Follow instructions from your health care provider about how to take care of your incision. Make sure you: ? Wash your hands with soap and water before and after you change your bandage (dressing). If soap and water are not available, use hand sanitizer. ? Change your dressing as told by your health care provider. ? Leave stitches (sutures), skin glue, or adhesive strips in place. These skin closures may need to stay in place for 2 weeks or longer. If adhesive strip edges start to loosen and curl up, you may trim the loose edges. Do not  remove adhesive strips completely unless your health care provider tells you to do that.  Check your incision area every day for signs of infection. Check for: ? Redness, swelling, or pain. ? Fluid or blood. ? Warmth. ? Pus or a bad smell. Activity  Rest as told by your health care provider.  Avoid sitting for a long time without moving. Get up to take short walks every 1-2 hours. This is important to improve blood flow and breathing. Ask for help if you feel weak or unsteady.  Return to your normal activities as told by your health care provider. Ask your health care provider what activities are safe for you. General instructions  Do not take baths, swim, or use a hot tub until your health care provider approves. Ask your health care provider if you may take showers. You may only be allowed to take sponge baths.  Have someone help you with your daily household tasks for the first few days.  Keep all follow-up visits as told by your health care provider. This is important. Contact a health care provider if:  You have redness, swelling, or pain around your incision.  Your incision feels warm to the touch.  You have pus or a bad smell coming from your incision.  The edges of your incision break open after the sutures have been removed.  Your pain does not improve after 2-3 days.  You have a rash.  You repeatedly become dizzy or light-headed.  Your pain medicine is not helping. Get help  right away if you:  Have a fever.  Faint.  Have increasing pain in your abdomen.  Have severe pain in one or both of your shoulders.  Have fluid or blood coming from your sutures or from your vagina.  Have shortness of breath or difficulty breathing.  Have chest pain or leg pain.  Have ongoing nausea, vomiting, or diarrhea. Summary  After the procedure, it is common to have mild discomfort or cramping in your abdomen.  Take over-the-counter and prescription medicines only as  told by your health care provider.  Watch for symptoms that should prompt you to call your health care provider.  Keep all follow-up visits as told by your health care provider. This is important. This information is not intended to replace advice given to you by your health care provider. Make sure you discuss any questions you have with your health care provider. Document Released: 01/21/2005 Document Revised: 05/29/2018 Document Reviewed: 05/29/2018 Elsevier Patient Education  [place] Instructions  Activity: Get plenty of rest for the remainder of the day. A responsible individual must stay with you for 24 hours following the procedure.  For the next 24 hours, DO NOT: -Drive a car -Paediatric nurse -Drink alcoholic beverages -Take any medication unless instructed by your physician -Make any legal decisions or sign important papers.  Meals: Start with liquid foods such as gelatin or soup. Progress to regular foods as tolerated. Avoid greasy, spicy, heavy foods. If nausea and/or vomiting occur, drink only clear liquids until the nausea and/or vomiting subsides. Call your physician if vomiting continues.  Special Instructions/Symptoms: Your throat may feel dry or sore from the anesthesia or the breathing tube placed in your throat during surgery. If this causes discomfort, gargle with warm salt water. The discomfort should disappear within 24 hours.  If you had a scopolamine patch placed behind your ear for the management of post- operative nausea and/or vomiting:  1. The medication in the patch is effective for 72 hours, after which it should be removed.  Wrap patch in a tissue and discard in the trash. Wash hands thoroughly with soap and water. 2. You may remove the patch earlier than 72 hours if you experience unpleasant side effects which may include dry mouth, dizziness or visual disturbances. 3. Avoid touching the patch. Wash your  hands with soap and water after contact with the patch.

## 2019-01-29 NOTE — Anesthesia Preprocedure Evaluation (Addendum)
Anesthesia Evaluation  Patient identified by MRN, date of birth, ID band Patient awake    Reviewed: Allergy & Precautions, NPO status , Patient's Chart, lab work & pertinent test results  Airway Mallampati: I  TM Distance: >3 FB Neck ROM: Full    Dental no notable dental hx. (+)    Pulmonary neg pulmonary ROS,    Pulmonary exam normal breath sounds clear to auscultation       Cardiovascular Normal cardiovascular exam Rhythm:Regular Rate:Normal     Neuro/Psych PSYCHIATRIC DISORDERS Anxiety Depression negative neurological ROS     GI/Hepatic negative GI ROS, Neg liver ROS,   Endo/Other  Morbid obesity  Renal/GU negative Renal ROS  negative genitourinary   Musculoskeletal negative musculoskeletal ROS (+)   Abdominal   Peds  Hematology  (+) Blood dyscrasia (Hgb 10.6), anemia ,   Anesthesia Other Findings   Reproductive/Obstetrics                            Anesthesia Physical Anesthesia Plan  ASA: III  Anesthesia Plan: General   Post-op Pain Management:    Induction: Intravenous  PONV Risk Score and Plan: 3 and Ondansetron, Dexamethasone and Midazolam  Airway Management Planned: Oral ETT  Additional Equipment:   Intra-op Plan:   Post-operative Plan: Extubation in OR  Informed Consent: I have reviewed the patients History and Physical, chart, labs and discussed the procedure including the risks, benefits and alternatives for the proposed anesthesia with the patient or authorized representative who has indicated his/her understanding and acceptance.     Dental advisory given  Plan Discussed with: CRNA  Anesthesia Plan Comments:         Anesthesia Quick Evaluation

## 2019-01-29 NOTE — Op Note (Signed)
01/29/2019  8:14 AM  PATIENT:  Kimberly [name]  28 y.o. female  PRE-OPERATIVE DIAGNOSIS:  Undesired Fertility  POST-OPERATIVE DIAGNOSIS:  Undesired Fertility  PROCEDURE:  Procedure(s): LAPAROSCOPIC TUBAL LIGATION with Filshe Clips (Bilateral)  SURGEON:  Surgeon(s) and Role:    * Maggie Senseney C, MD - Primary  ANESTHESIA:   local and general  EBL:  2 mL   BLOOD ADMINISTERED:none  DRAINS: none   LOCAL MEDICATIONS USED:  MARCAINE     SPECIMEN:  No Specimen  DISPOSITION OF SPECIMEN:  N/A  COUNTS:  YES  TOURNIQUET:  * No tourniquets in log *  DICTATION: .Dragon Dictation  PLAN OF CARE: Discharge to home after PACU  PATIENT DISPOSITION:  PACU - hemodynamically stable.   Delay start of Pharmacological VTE agent (>24hrs) due to surgical blood loss or risk of bleeding: not applicable   The risks, benefits, alternatives of surgery were explained understood, accepted. All questions were answered. She understands the failure rate of 1/400. She declines alternative forms of birth control. Her urine pregnancy test was negative. She was taken to the operating room and general anesthesia was applied without complication. She was placed in dorsal lithotomy position. Her abdomen and vagina were prepped and draped in the usual sterile fashion. A time out procedure was done. A bimanual exam revealed a normal, size and shape mobileretroverted uterus with nonenlarged adnexa. A Hulka manipulator was placed on the cervix. Gloves were changed and attention was turned to the abdomen. Approximately 10 mL of 0.5% Marcaine was used to infiltrate the subcutaneous tissue at the umbilicus. A vertical 1 cm incision was made. A Veres needle was placed in the pelvis. Low-flow CO2 was used to insufflate the abdomen to approximately 3 L. After good pneumoperitoneum was established, a 11 mm trocar was placed. Laparoscopy confirmed correct placement. She was placed in the Trendelenburg position. Patient abdominal  pressure was always less than 15. Her uterus ovaries and tubes appeared normal. There was no evidence of endometriosis. Her upper abdomen appeared normal. When I was manipulating the uterus, it appeared to be soft and somewhat mushy. The isthmic regions of her oviducts were tucked underneath the retroflexed uterus. I therefore placed a 5 mm port in the left lower quadrant under laparoscopic visualization after injecting 10 mL of 0.5% marcaine. By doing this I was able to grasp the fimbriated end of each tube and place a Filsche clip across the entire tube on each side in the isthmic region.  There was no bleeding. The CO2 was allowed to escape from her abdomen. The umbilical fascia was closed with a figure of 0 Vicryl suture. No defects were palpable. The subcuticular closure was done with 4-0 Vicryl suture at both sites. The Hulka manipulator was removed. She was extubated and taken to recovery in stable condition. She tolerated the procedure well. The instrument, sponge, and needle counts were correct.

## 2019-01-29 NOTE — Anesthesia Postprocedure Evaluation (Signed)
Anesthesia Post Note  Patient: Kimberly [name]  Procedure(s) Performed: LAPAROSCOPIC TUBAL LIGATION with Sharmon Leyden Clips (Bilateral )     Patient location during evaluation: PACU Anesthesia Type: General Level of consciousness: awake and alert Pain management: pain level controlled Vital Signs Assessment: post-procedure vital signs reviewed and stable Respiratory status: spontaneous breathing, nonlabored ventilation, respiratory function stable and patient connected to nasal cannula oxygen Cardiovascular status: blood pressure returned to baseline and stable Postop Assessment: no apparent nausea or vomiting Anesthetic complications: no    Last Vitals:  Vitals:   01/29/19 0930 01/29/19 1015  BP: 112/66 131/83  Pulse: 83 88  Resp: (!) 23 18  Temp:  37.2 C  SpO2: 98% 96%    Last Pain:  Vitals:   01/29/19 1015  TempSrc:   PainSc: 4                  Kemar Pandit L Hadlee Burback

## 2019-01-29 NOTE — H&P (Signed)
Kimberly [name] is a 28 y.o. [phone] female being evaluated today for a pre op visit. She is certain that she does not want more kids. She got a depo provera shot on 11/07/18. She understands that there is a small failure rate.      Menstrual History: Menarche age: 8 Patient's last menstrual period was 01/11/2019 (exact date).    Past Medical History:  Diagnosis Date  . Anxiety    because of last preg,scared something is wrong  . Chlamydia   . Constipation   . Depression    after loss of preg/child  . Gonorrhea   . Vaginal Pap smear, abnormal     Past Surgical History:  Procedure Laterality Date  . CESAREAN SECTION N/A 03/20/2013   Procedure: CESAREAN SECTION;  Surgeon: Frederico Hamman, MD;  Location: [facility];  Service: Obstetrics;  Laterality: N/A;    Family History  Problem Relation Age of Onset  . Asthma Son   . Hypertension Maternal Grandmother   . Varicose Veins Maternal Grandmother   . Cancer Neg Hx   . Diabetes Neg Hx   . Heart disease Neg Hx   . Stroke Neg Hx   . Hearing loss Neg Hx     Social History:  reports that she has never smoked. She has never used smokeless tobacco. She reports that she does not drink alcohol or use drugs.  Allergies: No Known Allergies  Medications Prior to Admission  Medication Sig Dispense Refill Last Dose  . medroxyPROGESTERone (DEPO-PROVERA) 150 MG/ML injection Inject 1 mL (150 mg total) into the muscle once for 1 dose. (Patient taking differently: Inject 150 mg into the muscle once. ) 1 mL 0     ROS  Homemaker  Blood pressure 126/75, pulse 83, temperature 98.8 F (37.1 C), temperature source Oral, resp. rate 18, height 5\' 3"  (1.6 m), weight 116.9 kg, last menstrual period 01/11/2019, SpO2 99 %, not currently breastfeeding. Physical Exam  Breathing, conversing, and ambulating normally Well nourished, well hydrated Black female, no apparent distress Heart- rrr Lungs- CTAB Abd- benign, obese  No results found for this  or any previous visit (from the past 24 hour(s)).  No results found.  Assessment/Plan: Unwanted fertility- plan for laparoscopic application of Filsche clips.  She understands the risks of surgery, including, but not to infection, bleeding, DVTs, damage to bowel, bladder, ureters. She wishes to proceed.     Amandamarie Feggins C Natalye Kott 01/29/2019, 7:00 AM

## 2019-01-29 NOTE — Transfer of Care (Signed)
Immediate Anesthesia Transfer of Care Note  Patient: Kimberly [name]  Procedure(s) Performed: LAPAROSCOPIC TUBAL LIGATION with Sharmon Leyden Clips (Bilateral )  Patient Location: PACU  Anesthesia Type:General  Level of Consciousness: drowsy and patient cooperative  Airway & Oxygen Therapy: Patient Spontanous Breathing and Patient connected to face mask oxygen  Post-op Assessment: Report given to RN and Post -op Vital signs reviewed and stable  Post vital signs: Reviewed and stable  Last Vitals:  Vitals Value Taken Time  BP    Temp    Pulse 80 01/29/19 0826  Resp 24 01/29/19 0826  SpO2 100 % 01/29/19 0826  Vitals shown include unvalidated device data.  Last Pain:  Vitals:   01/29/19 0635  TempSrc: Oral  PainSc: 0-No pain      Patients Stated Pain Goal: 0 (87/56/43 [id number])  Complications: No apparent anesthesia complications

## 2019-01-30 ENCOUNTER — Encounter (HOSPITAL_BASED_OUTPATIENT_CLINIC_OR_DEPARTMENT_OTHER): Payer: Self-pay | Admitting: Obstetrics & Gynecology

## 2019-02-20 ENCOUNTER — Ambulatory Visit: Payer: Medicaid Other | Admitting: Obstetrics & Gynecology

## 2019-08-19 ENCOUNTER — Ambulatory Visit
Admission: EM | Admit: 2019-08-19 | Discharge: 2019-08-19 | Disposition: A | Discharge status: Home / Self Care | Payer: Medicaid Other | Attending: Physician Assistant | Admitting: Physician Assistant

## 2019-08-19 ENCOUNTER — Ambulatory Visit (INDEPENDENT_AMBULATORY_CARE_PROVIDER_SITE_OTHER): Payer: Medicaid Other

## 2019-08-19 DIAGNOSIS — R079 Chest pain, unspecified: Secondary | ICD-10-CM

## 2019-08-19 DIAGNOSIS — R0789 Other chest pain: Secondary | ICD-10-CM | POA: Diagnosis not present

## 2019-08-19 DIAGNOSIS — S8011XA Contusion of right lower leg, initial encounter: Secondary | ICD-10-CM | POA: Diagnosis not present

## 2019-08-19 MED ORDER — METHOCARBAMOL 500 MG PO TABS: 500.0000 mg | ORAL_TABLET | Freq: Two times a day (BID) | ORAL | 0 refills | Status: DC
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
## 2021-01-30 ENCOUNTER — Emergency Department (HOSPITAL_COMMUNITY)
Admission: EM | Admit: 2021-01-30 | Discharge: 2021-01-31 | Disposition: A | Discharge status: Home / Self Care | Payer: Medicaid Other | Attending: Emergency Medicine | Admitting: Emergency Medicine

## 2021-01-30 ENCOUNTER — Encounter (HOSPITAL_COMMUNITY): Payer: Self-pay | Admitting: Emergency Medicine

## 2021-01-30 ENCOUNTER — Other Ambulatory Visit: Payer: Self-pay

## 2021-01-30 DIAGNOSIS — R319 Hematuria, unspecified: Secondary | ICD-10-CM | POA: Diagnosis not present

## 2021-01-30 DIAGNOSIS — N3001 Acute cystitis with hematuria: Secondary | ICD-10-CM

## 2021-01-30 DIAGNOSIS — R300 Dysuria: Secondary | ICD-10-CM | POA: Diagnosis not present

## 2021-01-30 LAB — URINALYSIS, ROUTINE W REFLEX MICROSCOPIC
Bilirubin Urine: NEGATIVE
Glucose, UA: NEGATIVE mg/dL
Ketones, ur: NEGATIVE mg/dL
Nitrite: NEGATIVE
Protein, ur: 30 mg/dL — AB
RBC / HPF: >50 RBC/hpf — ABNORMAL HIGH (ref 0–5)
Specific Gravity, Urine: 1.016 (ref 1.005–1.030)
WBC, UA: >50 WBC/hpf — ABNORMAL HIGH (ref 0–5)
pH: 6 (ref 5.0–8.0)

## 2021-01-30 NOTE — ED Triage Notes (Signed)
Pt reports painful urination and blood in her urine X1 day.  No other symptoms at this time.

## 2021-01-31 LAB — PREGNANCY, URINE: Preg Test, Ur: NEGATIVE

## 2021-01-31 MED ORDER — PHENAZOPYRIDINE HCL 200 MG PO TABS: 200.0000 mg | ORAL_TABLET | Freq: Three times a day (TID) | ORAL | 0 refills | Status: DC

## 2021-01-31 MED ORDER — CEPHALEXIN 250 MG PO CAPS
500.0000 mg | ORAL_CAPSULE | Freq: Once | ORAL | Status: AC
  Administered 2021-01-31: 500 mg via ORAL
  Filled 2021-01-31: qty 2

## 2021-01-31 MED ORDER — CEPHALEXIN 500 MG PO CAPS: 500.0000 mg | ORAL_CAPSULE | Freq: Four times a day (QID) | ORAL | 0 refills | Status: DC

## 2021-01-31 NOTE — ED Provider Notes (Signed)
[facility] EMERGENCY DEPARTMENT Provider Note   CSN: [id number] Arrival date & time: 01/30/21  2139     History Chief Complaint  Patient presents with   Hematuria    Kimberly [name] is a 30 y.o. female.  Patient is a 30 year old female with history of anxiety.  Patient presenting today for evaluation of dysuria.  She woke up this morning with burning with her urine stream followed by small blood clots and blood-tinged urine.  This has been worsening throughout the day.  She does report some back pain, but denies any abdominal pain.  She denies any vaginal bleeding or discharge.  She denies fevers or chills.  She denies history of prior UTIs.  The history is provided by the patient.  Hematuria This is a new problem. The current episode started 12 to 24 hours ago. The problem occurs constantly. The problem has been gradually worsening. Associated symptoms include abdominal pain. Nothing aggravates the symptoms. Nothing relieves the symptoms. She has tried nothing for the symptoms.      Past Medical History:  Diagnosis Date   Anxiety    because of last preg,scared something is wrong   Chlamydia    Constipation    Depression    after loss of preg/child   Gonorrhea    Vaginal Pap smear, abnormal     Patient Active Problem List   Diagnosis Date Noted   Gestational hypertension without significant proteinuria 09/05/2018   Encounter for induction of labor 09/04/2018   Morbid obesity (HCC) 09/04/2018   Limited prenatal care 09/04/2018   Hypertension affecting pregnancy in third trimester 09/20/2014   Hx of intrauterine fetal death, currently pregnant     Past Surgical History:  Procedure Laterality Date   CESAREAN SECTION N/A 03/20/2013   Procedure: CESAREAN SECTION;  Surgeon: Kathreen Cosier, MD;  Location: [facility];  Service: Obstetrics;  Laterality: N/A;   LAPAROSCOPIC TUBAL LIGATION Bilateral 01/29/2019   Procedure: LAPAROSCOPIC TUBAL LIGATION with  Anna Genre Clips;  Surgeon: Allie Bossier, MD;  Location: [facility];  Service: Gynecology;  Laterality: Bilateral;     OB History     Gravida  6   Para  6   Term  6   Preterm  0   AB  0   Living  5      SAB  0   IAB  0   Ectopic  0   Multiple  0   Live Births  5           Family History  Problem Relation Age of Onset   Asthma Son    Hypertension Maternal Grandmother    Varicose Veins Maternal Grandmother    Cancer Neg Hx    Diabetes Neg Hx    Heart disease Neg Hx    Stroke Neg Hx    Hearing loss Neg Hx     Social History   Tobacco Use   Smoking status: Never   Smokeless tobacco: Never  Vaping Use   Vaping Use: Never used  Substance Use Topics   Alcohol use: No   Drug use: No    Home Medications Prior to Admission medications   Medication Sig Start Date End Date Taking? Authorizing Provider  methocarbamol (ROBAXIN) 500 MG tablet Take 1 tablet (500 mg total) by mouth 2 (two) times daily. 08/19/19   Belinda Fisher, PA-C    Allergies    Patient has no known allergies.  Review of Systems  Review of Systems  Gastrointestinal:  Positive for abdominal pain.  Genitourinary:  Positive for hematuria.  All other systems reviewed and are negative.  Physical Exam Updated Vital Signs BP 117/79   Pulse 82   Temp 99 F (37.2 C) (Oral)   Resp 16   Ht 5\' 4"  (1.626 m)   Wt 121.1 kg   SpO2 98%   BMI 45.83 kg/m   Physical Exam Vitals and nursing note reviewed.  Constitutional:      General: She is not in acute distress.    Appearance: She is well-developed. She is not diaphoretic.  HENT:     Head: Normocephalic and atraumatic.  Cardiovascular:     Rate and Rhythm: Normal rate and regular rhythm.     Heart sounds: No murmur heard.   No friction rub. No gallop.  Pulmonary:     Effort: Pulmonary effort is normal. No respiratory distress.     Breath sounds: Normal breath sounds. No wheezing.  Abdominal:     General: Bowel sounds are  normal. There is no distension.     Palpations: Abdomen is soft.     Tenderness: There is no abdominal tenderness.  Musculoskeletal:        General: Normal range of motion.     Cervical back: Normal range of motion and neck supple.  Skin:    General: Skin is warm and dry.  Neurological:     General: No focal deficit present.     Mental Status: She is alert and oriented to person, place, and time.    ED Results / Procedures / Treatments   Labs (all labs ordered are listed, but only abnormal results are displayed) Labs Reviewed  URINALYSIS, ROUTINE W REFLEX MICROSCOPIC - Abnormal; Notable for the following components:      Result Value   APPearance HAZY (*)    Hgb urine dipstick LARGE (*)    Protein, ur 30 (*)    Leukocytes,Ua MODERATE (*)    RBC / HPF >50 (*)    WBC, UA >50 (*)    Bacteria, UA FEW (*)    All other components within normal limits    EKG None  Radiology No results found.  Procedures Procedures   Medications Ordered in ED Medications  cephALEXin (KEFLEX) capsule 500 mg (has no administration in time range)    ED Course  I have reviewed the triage vital signs and the nursing notes.  Pertinent labs & imaging results that were available during my care of the patient were reviewed by me and considered in my medical decision making (see chart for details).    MDM Rules/Calculators/A&P  Patient will be treated for UTI with Keflex and Pyridium.  To follow-up as needed.  Final Clinical Impression(s) / ED Diagnoses Final diagnoses:  None    Rx / DC Orders ED Discharge Orders     None        , MD 01/31/21 0105

## 2021-01-31 NOTE — Discharge Instructions (Addendum)
Begin taking Keflex and Pyridium as prescribed.  Follow-up with primary doctor if symptoms are not improving in the next few days, and return to the ER if you develop severe abdominal pain, high fevers, or other new and concerning symptoms.

## 2021-11-06 ENCOUNTER — Ambulatory Visit
Admission: EM | Admit: 2021-11-06 | Discharge: 2021-11-06 | Disposition: A | Discharge status: Home / Self Care | Payer: Medicaid Other | Attending: Urgent Care | Admitting: Urgent Care

## 2021-11-06 ENCOUNTER — Encounter: Payer: Self-pay | Admitting: Emergency Medicine

## 2021-11-06 ENCOUNTER — Other Ambulatory Visit: Payer: Self-pay

## 2021-11-06 DIAGNOSIS — N3000 Acute cystitis without hematuria: Secondary | ICD-10-CM | POA: Diagnosis not present

## 2021-11-06 LAB — POCT URINALYSIS DIP (MANUAL ENTRY)
Bilirubin, UA: NEGATIVE
Glucose, UA: NEGATIVE mg/dL
Ketones, POC UA: NEGATIVE mg/dL
Nitrite, UA: POSITIVE — AB
Spec Grav, UA: 1.025 (ref 1.010–1.025)
Urobilinogen, UA: 1 E.U./dL
pH, UA: 6.5 (ref 5.0–8.0)

## 2021-11-06 LAB — POCT URINE PREGNANCY: Preg Test, Ur: NEGATIVE

## 2021-11-06 MED ORDER — NITROFURANTOIN MONOHYD MACRO 100 MG PO CAPS: 100.0000 mg | ORAL_CAPSULE | Freq: Two times a day (BID) | ORAL | 0 refills | Status: DC

## 2021-11-06 NOTE — ED Triage Notes (Signed)
Patient c/o possible UTI, patient states that her urine was cloudy and smelled like "eggs" this morning when urinating.  No urgency, frequency or dysuria.  No concern for STI. ?

## 2021-11-06 NOTE — ED Provider Notes (Signed)
?[facility] ? ? ? ?CSN: [id number] ?Arrival date & time: 11/06/21  0848 ? ? ?  ? ?History   ?Chief Complaint ?Chief Complaint  ?Patient presents with  ? Possible UTI  ? ? ?HPI ?Kimberly [name] is a 31 y.o. female.  ? ?Pleasant 31 year old female presents today with concern of the smell of eggs and when she urinates starting this morning.  She reports over the past 2 months her urine has looked abnormally cloudy.  She has not had dysuria, urgency, or pain.  She states she been taking over-the-counter UTI treatments, which seemed to resolve the issue.  Her last normal menstrual period was beginning of the month.  She denies any pelvic pain, hematuria, flank pain, fever, vaginal discharge.  She denies the need for STI testing. ? ? ? ?Past Medical History:  ?Diagnosis Date  ? Anxiety   ? because of last preg,scared something is wrong  ? Chlamydia   ? Constipation   ? Depression   ? after loss of preg/child  ? Gonorrhea   ? Vaginal Pap smear, abnormal   ? ? ?Patient Active Problem List  ? Diagnosis Date Noted  ? Gestational hypertension without significant proteinuria 09/05/2018  ? Encounter for induction of labor 09/04/2018  ? Morbid obesity (HCC) 09/04/2018  ? Limited prenatal care 09/04/2018  ? Hypertension affecting pregnancy in third trimester 09/20/2014  ? Hx of intrauterine fetal death, currently pregnant   ? ? ?Past Surgical History:  ?Procedure Laterality Date  ? CESAREAN SECTION N/A 03/20/2013  ? Procedure: CESAREAN SECTION;  Surgeon: Kathreen Cosier, MD;  Location: [facility];  Service: Obstetrics;  Laterality: N/A;  ? LAPAROSCOPIC TUBAL LIGATION Bilateral 01/29/2019  ? Procedure: LAPAROSCOPIC TUBAL LIGATION with Anna Genre Clips;  Surgeon: Allie Bossier, MD;  [facility];  Service: Gynecology;  Laterality: Bilateral;  ? ? ?OB History   ? ? Gravida  ?6  ? Para  ?6  ? Term  ?6  ? Preterm  ?0  ? AB  ?0  ? Living  ?5  ?  ? ? SAB  ?0  ? IAB  ?0  ? Ectopic  ?0  ? Multiple  ?0  ? Live  Births  ?5  ?   ?  ?  ? ? ? ?Home Medications   ? ?Prior to Admission medications   ?Medication Sig Start Date End Date Taking? Authorizing Provider  ?nitrofurantoin, macrocrystal-monohydrate, (MACROBID) 100 MG capsule Take 1 capsule (100 mg total) by mouth 2 (two) times daily. 11/06/21  Yes Rowin Bayron L, PA  ? ? ?Family History ?Family History  ?Problem Relation Age of Onset  ? Asthma Son   ? Hypertension Maternal Grandmother   ? Varicose Veins Maternal Grandmother   ? Cancer Neg Hx   ? Diabetes Neg Hx   ? Heart disease Neg Hx   ? Stroke Neg Hx   ? Hearing loss Neg Hx   ? ? ?Social History ?Social History  ? ?Tobacco Use  ? Smoking status: Never  ? Smokeless tobacco: Never  ?Vaping Use  ? Vaping Use: Never used  ?Substance Use Topics  ? Alcohol use: No  ? Drug use: No  ? ? ? ?Allergies   ?Patient has no known allergies. ? ? ?Review of Systems ?Review of Systems  ?Genitourinary:  Negative for dysuria, frequency and urgency.  ?     Urine odor and cloudiness  ?All other systems reviewed and are negative. ? ? ?Physical Exam ?  Triage Vital Signs ?ED Triage Vitals  ?Enc Vitals Group  ?   BP 11/06/21 0944 132/84  ?   Pulse Rate 11/06/21 0944 81  ?   Resp 11/06/21 0944 18  ?   Temp 11/06/21 0944 98.4 ?F (36.9 ?C)  ?   Temp Source 11/06/21 0944 Oral  ?   SpO2 11/06/21 0944 96 %  ?   Weight 11/06/21 0945 256 lb (116.1 kg)  ?   Height 11/06/21 0945 5\' 3"  (1.6 m)  ?   Head Circumference --   ?   Peak Flow --   ?   Pain Score 11/06/21 0945 0  ?   Pain Loc --   ?   Pain Edu? --   ?   Excl. in GC? --   ? ?No data found. ? ?Updated Vital Signs ?BP 132/84 (BP Location: Right Arm)   Pulse 81   Temp 98.4 ?F (36.9 ?C) (Oral)   Resp 18   Ht 5\' 3"  (1.6 m)   Wt 256 lb (116.1 kg)   LMP 10/16/2021   SpO2 96%   BMI 45.35 kg/m?  ? ?Visual Acuity ?Right Eye Distance:   ?Left Eye Distance:   ?Bilateral Distance:   ? ?Right Eye Near:   ?Left Eye Near:    ?Bilateral Near:    ? ?Physical Exam ?Vitals and nursing note reviewed.   ?Constitutional:   ?   General: She is not in acute distress. ?   Appearance: Normal appearance. She is obese. She is not ill-appearing, toxic-appearing or diaphoretic.  ?HENT:  ?   Head: Normocephalic and atraumatic.  ?Eyes:  ?   Pupils: Pupils are equal, round, and reactive to light.  ?Cardiovascular:  ?   Rate and Rhythm: Normal rate.  ?   Pulses: Normal pulses.  ?   Heart sounds: Normal heart sounds. No murmur heard. ?  No friction rub. No gallop.  ?Pulmonary:  ?   Effort: Pulmonary effort is normal. No respiratory distress.  ?   Breath sounds: Normal breath sounds. No wheezing or rales.  ?Chest:  ?   Chest wall: No tenderness.  ?Abdominal:  ?   General: Abdomen is flat. Bowel sounds are normal. There is no distension.  ?   Palpations: Abdomen is soft. There is no mass.  ?   Tenderness: There is no abdominal tenderness. There is no right CVA tenderness, left CVA tenderness, guarding or rebound.  ?   Hernia: No hernia is present.  ?Musculoskeletal:  ?   Right lower leg: No edema.  ?   Left lower leg: No edema.  ?Skin: ?   General: Skin is warm.  ?   Findings: No bruising, erythema or rash.  ?Neurological:  ?   General: No focal deficit present.  ?   Mental Status: She is alert. Mental status is at baseline.  ?Psychiatric:     ?   Mood and Affect: Mood normal.  ? ? ? ?UC Treatments / Results  ?Labs ?(all labs ordered are listed, but only abnormal results are displayed) ?Labs Reviewed  ?POCT URINALYSIS DIP (MANUAL ENTRY) - Abnormal; Notable for the following components:  ?    Result Value  ? Blood, UA trace-intact (*)   ? Protein Ur, POC trace (*)   ? Nitrite, UA Positive (*)   ? Leukocytes, UA Small (1+) (*)   ? All other components within normal limits  ?POCT URINE PREGNANCY  ? ? ?EKG ? ? ?Radiology ?No results  found. ? ?Procedures ?Procedures (including critical care time) ? ?Medications Ordered in UC ?Medications - No data to display ? ?Initial Impression / Assessment and Plan / UC Course  ?I have reviewed the  triage vital signs and the nursing notes. ? ?Pertinent labs & imaging results that were available during my care of the patient were reviewed by me and considered in my medical decision making (see chart for details). ? ?  ? ?Acute cystitis -UA dip consistent with UTI.  Will send urine culture out, Macrobid twice daily for 5 days.  Increase water consumption.  Red flag signs symptoms discussed with patient. ? ?Final Clinical Impressions(s) / UC Diagnoses  ? ?Final diagnoses:  ?Acute cystitis without hematuria  ? ? ? ?Discharge Instructions   ? ?  ?Your symptoms are consistent with a urinary tract infection. ?Start taking the antibiotic twice daily until completed. ?Increase your water intake or drink cranberry juice to help flush out the infection. ?Monitor for any change in or worsening symptoms; fever, hematuria or flank pain would warrant a recheck  ? ? ?ED Prescriptions   ? ? Medication Sig Dispense Auth. Provider  ? nitrofurantoin, macrocrystal-monohydrate, (MACROBID) 100 MG capsule Take 1 capsule (100 mg total) by mouth 2 (two) times daily. 10 capsule Bertran Zeimet L, PA  ? ?  ? ?PDMP not reviewed this encounter. ?  Maretta Bees?Kalla Watson L, [place] ?11/06/21 1727 ? ?

## 2021-11-06 NOTE — Discharge Instructions (Addendum)
Your symptoms are consistent with a urinary tract infection. ?Start taking the antibiotic twice daily until completed. ?Increase your water intake or drink cranberry juice to help flush out the infection. ?Monitor for any change in or worsening symptoms; fever, hematuria or flank pain would warrant a recheck  ?

## 2023-03-08 ENCOUNTER — Ambulatory Visit
Admission: EM | Admit: 2023-03-08 | Discharge: 2023-03-08 | Disposition: A | Discharge status: Home / Self Care | Payer: Medicaid Other | Attending: Physician Assistant | Admitting: Physician Assistant

## 2023-03-08 DIAGNOSIS — M5442 Lumbago with sciatica, left side: Secondary | ICD-10-CM

## 2023-03-08 MED ORDER — PREDNISONE 20 MG PO TABS: 40.0000 mg | ORAL_TABLET | Freq: Every day | ORAL | 0 refills | Status: AC

## 2023-03-08 MED ORDER — CYCLOBENZAPRINE HCL 10 MG PO TABS: 10.0000 mg | ORAL_TABLET | Freq: Two times a day (BID) | ORAL | 0 refills | Status: DC | PRN

## 2023-03-08 NOTE — ED Provider Notes (Signed)
[facility]    CSN: [id number] Arrival date & time: 03/08/23  1505      History   Chief Complaint Chief Complaint  Patient presents with   Back Pain    HPI Kimberly [name] is a 32 y.o. female.   Patient here today for evaluation of left lower back pain that radiates into her left buttocks and down her leg.  She states at times it feels as if she has tingling in her leg.  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  She does not report treatment.  Certain movements worsen pain.  The history is provided by the patient.  Back Pain Associated symptoms: no abdominal pain, no fever and no numbness     Past Medical History:  Diagnosis Date   Anxiety    because of last preg,scared something is wrong   Chlamydia    Constipation    Depression    after loss of preg/child   Gonorrhea    Vaginal Pap smear, abnormal     Patient Active Problem List   Diagnosis Date Noted   Gestational hypertension without significant proteinuria 09/05/2018   Encounter for induction of labor 09/04/2018   Morbid obesity (HCC) 09/04/2018   Limited prenatal care 09/04/2018   Hypertension affecting pregnancy in third trimester 09/20/2014   Hx of intrauterine fetal death, currently pregnant     Past Surgical History:  Procedure Laterality Date   CESAREAN SECTION N/A 03/20/2013   Procedure: CESAREAN SECTION;  Surgeon: Kathreen Cosier, MD;  Location: [facility];  Service: Obstetrics;  Laterality: N/A;   LAPAROSCOPIC TUBAL LIGATION Bilateral 01/29/2019   Procedure: LAPAROSCOPIC TUBAL LIGATION with Anna Genre Clips;  Surgeon: Allie Bossier, MD;  Location: [facility];  Service: Gynecology;  Laterality: Bilateral;    OB History     Gravida  6   Para  6   Term  6   Preterm  0   AB  0   Living  5      SAB  0   IAB  0   Ectopic  0   Multiple  0   Live Births  5            Home Medications    Prior to  Admission medications   Medication Sig Start Date End Date Taking? Authorizing Provider  cyclobenzaprine (FLEXERIL) 10 MG tablet Take 1 tablet (10 mg total) by mouth 2 (two) times daily as needed for muscle spasms. 03/08/23  Yes Tomi Bamberger, PA-C  predniSONE (DELTASONE) 20 MG tablet Take 2 tablets (40 mg total) by mouth daily with breakfast for 5 days. 03/08/23 03/13/23 Yes Tomi Bamberger, PA-C  nitrofurantoin, macrocrystal-monohydrate, (MACROBID) 100 MG capsule Take 1 capsule (100 mg total) by mouth 2 (two) times daily. 11/06/21   Maretta Bees, PA    Family History Family History  Problem Relation Age of Onset   Asthma Son    Hypertension Maternal Grandmother    Varicose Veins Maternal Grandmother    Cancer Neg Hx    Diabetes Neg Hx    Heart disease Neg Hx    Stroke Neg Hx    Hearing loss Neg Hx     Social History Social History   Tobacco Use   Smoking status: Never   Smokeless tobacco: Never  Vaping Use   Vaping status: Never Used  Substance Use Topics   Alcohol use:  No   Drug use: No     Allergies   Patient has no known allergies.   Review of Systems Review of Systems  Constitutional:  Negative for chills and fever.  Eyes:  Negative for discharge and redness.  Gastrointestinal:  Negative for abdominal pain, nausea and vomiting.  Musculoskeletal:  Positive for back pain and myalgias.  Neurological:  Negative for numbness.     Physical Exam Triage Vital Signs ED Triage Vitals  Encounter Vitals Group     BP 03/08/23 1514 136/88     Systolic BP Percentile --      Diastolic BP Percentile --      Pulse Rate 03/08/23 1514 88     Resp 03/08/23 1514 18     Temp 03/08/23 1514 98.1 F (36.7 C)     Temp Source 03/08/23 1514 Oral     SpO2 03/08/23 1514 98 %     Weight 03/08/23 1513 263 lb (119.3 kg)     Height 03/08/23 1513 5\' 4"  (1.626 m)     Head Circumference --      Peak Flow --      Pain Score 03/08/23 1507 10     Pain Loc --      Pain Education --       Exclude from Growth Chart --    No data found.  Updated Vital Signs BP 136/88 (BP Location: Left Arm)   Pulse 88   Temp 98.1 F (36.7 C) (Oral)   Resp 18   Ht 5\' 4"  (1.626 m)   Wt 263 lb (119.3 kg)   LMP 03/04/2023 (Exact Date)   SpO2 98%   BMI 45.14 kg/m      Physical Exam Vitals and nursing note reviewed.  Constitutional:      General: She is not in acute distress.    Appearance: Normal appearance. She is not ill-appearing.  HENT:     Head: Normocephalic and atraumatic.  Eyes:     Conjunctiva/sclera: Conjunctivae normal.  Cardiovascular:     Rate and Rhythm: Normal rate.  Pulmonary:     Effort: Pulmonary effort is normal. No respiratory distress.  Musculoskeletal:     Comments: No TTP to midline spine, mild TTP to left lower back, positive left SLR  Neurological:     Mental Status: She is alert.  Psychiatric:        Mood and Affect: Mood normal.        Behavior: Behavior normal.        Thought Content: Thought content normal.      UC Treatments / Results  Labs (all labs ordered are listed, but only abnormal results are displayed) Labs Reviewed - No data to display  EKG   Radiology No results found.  Procedures Procedures (including critical care time)  Medications Ordered in UC Medications - No data to display  Initial Impression / Assessment and Plan / UC Course  I have reviewed the triage vital signs and the nursing notes.  Pertinent labs & imaging results that were available during my care of the patient were reviewed by me and considered in my medical decision making (see chart for details).    Steroid burst and muscle relaxer prescribed for suspected muscular strain with sciatica.  Recommended slow controlled stretches, heat application and massage.  Encouraged follow-up if no gradual improvement with any further concerns.  Final Clinical Impressions(s) / UC Diagnoses   Final diagnoses:  Acute left-sided low back pain with left-sided  sciatica  Discharge Instructions   None    ED Prescriptions     Medication Sig Dispense Auth. Provider   predniSONE (DELTASONE) 20 MG tablet Take 2 tablets (40 mg total) by mouth daily with breakfast for 5 days. 10 tablet Erma Pinto F, PA-C   cyclobenzaprine (FLEXERIL) 10 MG tablet Take 1 tablet (10 mg total) by mouth 2 (two) times daily as needed for muscle spasms. 20 tablet Tomi Bamberger, PA-C      PDMP not reviewed this encounter.   Tomi Bamberger, PA-C 03/08/23 [phone]

## 2023-03-08 NOTE — ED Triage Notes (Signed)
"  My back is hurting so bad on my left lower side that my left buttocks is numb now pain is radiating down left leg". 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 This started "some time back" but is getting worse".

## 2023-05-23 ENCOUNTER — Ambulatory Visit
Admission: EM | Admit: 2023-05-23 | Discharge: 2023-05-23 | Disposition: A | Discharge status: Home / Self Care | Payer: Medicaid Other | Attending: Physician Assistant | Admitting: Physician Assistant

## 2023-05-23 DIAGNOSIS — M545 Low back pain, unspecified: Secondary | ICD-10-CM

## 2023-05-23 MED ORDER — CYCLOBENZAPRINE HCL 10 MG PO TABS: 10.0000 mg | ORAL_TABLET | Freq: Two times a day (BID) | ORAL | 0 refills | Status: DC | PRN

## 2023-05-23 MED ORDER — PREDNISONE 20 MG PO TABS: 40.0000 mg | ORAL_TABLET | Freq: Every day | ORAL | 0 refills | Status: AC

## 2023-05-23 NOTE — ED Triage Notes (Signed)
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 "This pain is a little higher on left lower back then the previous sciatic pain and incident".

## 2023-06-14 ENCOUNTER — Encounter: Payer: Self-pay | Admitting: Physician Assistant

## 2023-06-14 NOTE — ED Provider Notes (Signed)
[facility]    CSN: [id number] Arrival date & time: 05/23/23  1746      History   Chief Complaint Chief Complaint  Patient presents with   Back Pain    HPI Kimberly [name] is a 32 y.o. female.   Patient here today for evaluation of left lower back pain that started recently.  She has had history of low back pain in the past with sciatica.  She states current pain feels somewhat higher than prior.  The history is provided by the patient.  Back Pain Associated symptoms: no abdominal pain, no fever and no numbness     Past Medical History:  Diagnosis Date   Anxiety    because of last preg,scared something is wrong   Chlamydia    Constipation    Depression    after loss of preg/child   Gonorrhea    Vaginal Pap smear, abnormal     Patient Active Problem List   Diagnosis Date Noted   Gestational hypertension without significant proteinuria 09/05/2018   Encounter for induction of labor 09/04/2018   Morbid obesity (HCC) 09/04/2018   Limited prenatal care 09/04/2018   Hypertension affecting pregnancy in third trimester 09/20/2014   Hx of intrauterine fetal death, currently pregnant     Past Surgical History:  Procedure Laterality Date   CESAREAN SECTION N/A 03/20/2013   Procedure: CESAREAN SECTION;  Surgeon: Kathreen Cosier, MD;  Location: [facility];  Service: Obstetrics;  Laterality: N/A;   LAPAROSCOPIC TUBAL LIGATION Bilateral 01/29/2019   Procedure: LAPAROSCOPIC TUBAL LIGATION with Anna Genre Clips;  Surgeon: Allie Bossier, MD;  Location: [facility];  Service: Gynecology;  Laterality: Bilateral;    OB History     Gravida  6   Para  6   Term  6   Preterm  0   AB  0   Living  5      SAB  0   IAB  0   Ectopic  0   Multiple  0   Live Births  5            Home Medications    Prior to Admission medications   Medication Sig Start Date End Date Taking? Authorizing Provider  cyclobenzaprine (FLEXERIL) 10 MG tablet Take 1  tablet (10 mg total) by mouth 2 (two) times daily as needed for muscle spasms. 05/23/23  Yes Tomi Bamberger, PA-C    Family History Family History  Problem Relation Age of Onset   Asthma Son    Hypertension Maternal Grandmother    Varicose Veins Maternal Grandmother    Cancer Neg Hx    Diabetes Neg Hx    Heart disease Neg Hx    Stroke Neg Hx    Hearing loss Neg Hx     Social History Social History   Tobacco Use   Smoking status: Never   Smokeless tobacco: Never  Vaping Use   Vaping status: Never Used  Substance Use Topics   Alcohol use: No   Drug use: No     Allergies   Patient has no known allergies.   Review of Systems Review of Systems  Constitutional:  Negative for chills and fever.  Eyes:  Negative for discharge and redness.  Gastrointestinal:  Negative for abdominal pain, nausea and vomiting.  Musculoskeletal:  Positive for back pain and myalgias.  Neurological:  Negative for numbness.     Physical Exam Triage Vital Signs ED Triage Vitals  Encounter Vitals  Group     BP 05/23/23 1856 (!) 151/90     Systolic BP Percentile --      Diastolic BP Percentile --      Pulse Rate 05/23/23 1856 90     Resp 05/23/23 1856 18     Temp 05/23/23 1856 98.6 F (37 C)     Temp Source 05/23/23 1856 Oral     SpO2 05/23/23 1856 97 %     Weight 05/23/23 1853 260 lb (117.9 kg)     Height 05/23/23 1853 5\' 4"  (1.626 m)     Head Circumference --      Peak Flow --      Pain Score 05/23/23 1849 7     Pain Loc --      Pain Education --      Exclude from Growth Chart --    No data found.  Updated Vital Signs BP (!) 151/90 (BP Location: Left Arm)   Pulse 90   Temp 98.6 F (37 C) (Oral)   Resp 18   Ht 5\' 4"  (1.626 m)   Wt 260 lb (117.9 kg)   LMP 04/29/2023 (Approximate)   SpO2 97%   BMI 44.63 kg/m   Visual Acuity Right Eye Distance:   Left Eye Distance:   Bilateral Distance:    Right Eye Near:   Left Eye Near:    Bilateral Near:     Physical  Exam Vitals and nursing note reviewed.  Constitutional:      General: She is not in acute distress.    Appearance: Normal appearance. She is not ill-appearing.  HENT:     Head: Normocephalic and atraumatic.  Eyes:     Conjunctiva/sclera: Conjunctivae normal.  Cardiovascular:     Rate and Rhythm: Normal rate.  Pulmonary:     Effort: Pulmonary effort is normal. No respiratory distress.  Musculoskeletal:     Comments: No tenderness to palpation of midline spine.  Mild tenderness to left low back  Neurological:     Mental Status: She is alert.  Psychiatric:        Mood and Affect: Mood normal.        Behavior: Behavior normal.        Thought Content: Thought content normal.      UC Treatments / Results  Labs (all labs ordered are listed, but only abnormal results are displayed) Labs Reviewed - No data to display  EKG   Radiology No results found.  Procedures Procedures (including critical care time)  Medications Ordered in UC Medications - No data to display  Initial Impression / Assessment and Plan / UC Course  I have reviewed the triage vital signs and the nursing notes.  Pertinent labs & imaging results that were available during my care of the patient were reviewed by me and considered in my medical decision making (see chart for details).    Will treat with muscle relaxer and steroid burst.  Advised muscle relaxer may cause drowsiness.  Encouraged to use with caution.  Encouraged follow-up with any further concerns or worsening symptoms.  Final Clinical Impressions(s) / UC Diagnoses   Final diagnoses:  Acute left-sided low back pain without sciatica   Discharge Instructions   None    ED Prescriptions     Medication Sig Dispense Auth. Provider   cyclobenzaprine (FLEXERIL) 10 MG tablet Take 1 tablet (10 mg total) by mouth 2 (two) times daily as needed for muscle spasms. 20 tablet Tomi Bamberger, PA-C  predniSONE (DELTASONE) 20 MG tablet Take 2 tablets  (40 mg total) by mouth daily with breakfast for 5 days. 10 tablet Tomi Bamberger, PA-C      PDMP not reviewed this encounter.   Tomi Bamberger, PA-C 06/14/23 1718

## 2023-08-03 ENCOUNTER — Telehealth: Payer: Self-pay | Admitting: General Practice

## 2023-08-03 ENCOUNTER — Ambulatory Visit: Payer: Medicaid Other | Admitting: Family Medicine

## 2023-08-03 NOTE — Telephone Encounter (Signed)
Called pt and left vm to call office back to reschedule missed new patient appt

## 2023-10-26 ENCOUNTER — Telehealth: Admitting: Physician Assistant

## 2023-10-26 DIAGNOSIS — B9689 Other specified bacterial agents as the cause of diseases classified elsewhere: Secondary | ICD-10-CM

## 2023-10-26 DIAGNOSIS — N760 Acute vaginitis: Secondary | ICD-10-CM

## 2023-10-26 MED ORDER — METRONIDAZOLE 500 MG PO TABS: 500.0000 mg | ORAL_TABLET | Freq: Two times a day (BID) | ORAL | 0 refills | Status: DC

## 2023-10-26 NOTE — Patient Instructions (Signed)
 Kimberly [name], thank you for joining [name], PA-C for today's virtual visit.  While this provider is not your primary care provider (PCP), if your PCP is located in our provider database this encounter information will be shared with them immediately following your visit.   A [facility] MyChart account gives you access to today's visit and all your visits, tests, and labs performed at [facility] " click here if you don't have a [facility] MyChart account or go to [email or web address]  Consent: (Patient) Kimberly [name] provided verbal consent for this virtual visit at the beginning of the encounter.  Current Medications:  Current Outpatient Medications:    cyclobenzaprine (FLEXERIL) 10 MG tablet, Take 1 tablet (10 mg total) by mouth 2 (two) times daily as needed for muscle spasms., Disp: 20 tablet, Rfl: 0   Medications ordered in this encounter:  No orders of the defined types were placed in this encounter.    *If you need refills on other medications prior to your next appointment, please contact your pharmacy*  Follow-Up: Call back or seek an in-person evaluation if the symptoms worsen or if the condition fails to improve as anticipated.  [facility] [phone]  Other Instructions Vaginal Infection (Bacterial Vaginosis): What to Know  Bacterial vaginosis is an infection of the vagina. It happens when the balance of normal germs (bacteria) in the vagina changes. If you don't get treated, it can make it easier for you to get other infections from sex. These are called sexually transmitted infections (STIs). If you're pregnant, you need to get treated right away. This infection can cause a baby to be born early or at a low birth weight. What are the causes? This infection happens when too many harmful germs grow in the vagina. You can't get this infection from toilet seats, bedsheets, swimming pools, or things that touch your  vagina. What increases the risk? Having sex with a new person or more than one person. Having sex without protection. Douching. Having an intrauterine device (IUD). Smoking. Using drugs or drinking alcohol. These can lead you to do risky things. Taking certain antibiotics. Being pregnant. What are the signs or symptoms? Some females have no symptoms. Symptoms may include: A gray or white discharge from your vagina. It can be watery or foamy. A fishy smell. This can happen after sex or during your menstrual period. Itching in and around your vagina. Burning or pain when you pee. How is this treated? This infection is treated with antibiotics. These may be given to you as: A pill. A cream for your vagina. A medicine that you put into your vagina (suppository). If the infection comes back, you may need more antibiotics. Follow these instructions at home: Medicines Take your medicines as told. Take or use your antibiotics as told. Do not stop using them even if you start to feel better. General instructions If the person you have sex with is a female, tell her that you have this infection. She will need to follow up with her doctor. Female partners don't need to be treated. Do not have sex until you finish treatment. Drink more fluids as told. Keep your vagina and butt clean. Wash these areas with warm water each day. Wipe from front to back after you poop. If you're breastfeeding a baby, talk to your doctor if you should keep doing so during treatment. How is this prevented? Self-care Do not douche. Do not use deodorant sprays on your vagina.  Wear cotton underwear. Do not wear tight pants and pantyhose, especially in the summer. Safe sex Use condoms the correct way and every time you have sex. Use dental dams to protect yourself during oral sex. Limit how many people you have sex with. Get tested for STIs. The person you have sex with should also get tested. Drugs and  alcohol Do not smoke, vape, or use nicotine or tobacco. Do not use drugs. Limit the amount of alcohol you drink because it can lead you to do risky things. Where to find more information To learn more: Go to TonerPromos.no. Click Health Topics A-Z. Type "bacterial vaginosis" in the search bar. American Sexual Health Association (ASHA): ashasexualhealth.org U.S. Department of Health and [employer], Office on Women's Health: TravelLesson.ca Contact a doctor if: Your symptoms don't get better, even after treatment. You have more discharge or pain when you pee. You have a fever or chills. You have pain in your belly or in the area between your hips. You have pain during sex. You bleed from your vagina between menstrual periods. This information is not intended to replace advice given to you by your health care provider. Make sure you discuss any questions you have with your health care provider. Document Revised: 12/21/2022 Document Reviewed: 12/21/2022 Elsevier Patient Education  2024 Elsevier Inc.   If you have been instructed to have an in-person evaluation today at a local Urgent Care facility, please use the link below. It will take you to a list of all of our available [facility], including address, phone number and hours of operation. Please do not delay care.  [facility]  If you or a family member do not have a primary care provider, use the link below to schedule a visit and establish care. When you choose a [facility] primary care physician or advanced practice provider, you gain a long-term partner in health. Find a Primary Care Provider  Learn more about [facility]'s in-office and virtual care options: [facility] - Get Care Now

## 2023-10-26 NOTE — Progress Notes (Signed)
 Virtual Visit Consent   Kimberly [name], you are scheduled for a virtual visit with a [facility] provider today. Just as with appointments in the office, your consent must be obtained to participate. Your consent will be active for this visit and any virtual visit you may have with one of our providers in the next 365 days. If you have a MyChart account, a copy of this consent can be sent to you electronically.  As this is a virtual visit, video technology does not allow for your provider to perform a traditional examination. This may limit your provider's ability to fully assess your condition. If your provider identifies any concerns that need to be evaluated in person or the need to arrange testing (such as labs, EKG, etc.), we will make arrangements to do so. Although advances in technology are sophisticated, we cannot ensure that it will always work on either your end or our end. If the connection with a video visit is poor, the visit may have to be switched to a telephone visit. With either a video or telephone visit, we are not always able to ensure that we have a secure connection.  By engaging in this virtual visit, you consent to the provision of healthcare and authorize for your insurance to be billed (if applicable) for the services provided during this visit. Depending on your insurance coverage, you may receive a charge related to this service.  I need to obtain your verbal consent now. Are you willing to proceed with your visit today? [name] has provided verbal consent on 10/26/2023 for a virtual visit (video or telephone). Kimberly [name], [place]  Date: 10/26/2023 12:51 PM   Virtual Visit via Video Note   I, Kimberly [name], connected with  Kimberly [name]  ([id number], Jul 01, 1991) on 10/26/23 at 12:45 PM EDT by a video-enabled telemedicine application and verified that I am speaking with the correct person using two identifiers.  Location: Patient: Virtual Visit  Location Patient: Home Provider: Virtual Visit Location Provider: Home Office   I discussed the limitations of evaluation and management by telemedicine and the availability of in person appointments. The patient expressed understanding and agreed to proceed.    History of Present Illness: Kimberly [name] is a 33 y.o. who identifies as a female who was assigned female at birth, and is being seen today for vaginal irritation with foul-odor, off and on over the past few weeks, worsening again after her menstrual period this past week. Notes she is taking a vaginal probiotic that is only slightly helping. Some discomfort with intercourse. No overt pelvic pain. Denies new sexual partner or concern for STI. Denies UTI symptoms.   HPI: HPI  Problems:  Patient Active Problem List   Diagnosis Date Noted   Gestational hypertension without significant proteinuria 09/05/2018   Encounter for induction of labor 09/04/2018   Morbid obesity (HCC) 09/04/2018   Limited prenatal care 09/04/2018   Hypertension affecting pregnancy in third trimester 09/20/2014   Hx of intrauterine fetal death, currently pregnant     Allergies: No Known Allergies Medications:  Current Outpatient Medications:    metroNIDAZOLE (FLAGYL) 500 MG tablet, Take 1 tablet (500 mg total) by mouth 2 (two) times daily., Disp: 14 tablet, Rfl: 0  Observations/Objective: Patient is well-developed, well-nourished in no acute distress.  Resting comfortably  at home.  Head is normocephalic, atraumatic.  No labored breathing.  Speech is clear and coherent with logical content.  Patient is alert and oriented  at baseline.   Assessment and Plan: 1. Bacterial vaginosis (Primary) - metroNIDAZOLE (FLAGYL) 500 MG tablet; Take 1 tablet (500 mg total) by mouth 2 (two) times daily.  Dispense: 14 tablet; Refill: 0  Classic symptoms. Supportive measures reviewed. Flagyl per orders.   Follow Up Instructions: I discussed the assessment and  treatment plan with the patient. The patient was provided an opportunity to ask questions and all were answered. The patient agreed with the plan and demonstrated an understanding of the instructions.  A copy of instructions were sent to the patient via MyChart unless otherwise noted below.   The patient was advised to call back or seek an in-person evaluation if the symptoms worsen or if the condition fails to improve as anticipated.    Kimberly Climes, PA-C

## 2023-11-02 ENCOUNTER — Telehealth: Admitting: Family Medicine

## 2023-11-02 NOTE — Progress Notes (Signed)
 Need to have son seen, helped to get appt for him. [place]

## 2023-12-28 ENCOUNTER — Encounter: Admitting: Family Medicine

## 2023-12-28 ENCOUNTER — Encounter: Payer: Self-pay | Admitting: Family Medicine

## 2023-12-28 NOTE — Progress Notes (Signed)
 Need to have son seen, helped to get appt for him. [place]

## 2024-05-06 ENCOUNTER — Telehealth: Admitting: Family Medicine

## 2024-05-06 DIAGNOSIS — B3731 Acute candidiasis of vulva and vagina: Secondary | ICD-10-CM

## 2024-05-06 MED ORDER — FLUCONAZOLE 150 MG PO TABS: 150.0000 mg | ORAL_TABLET | Freq: Every day | ORAL | 0 refills | Status: AC

## 2024-05-06 NOTE — Progress Notes (Signed)
 Virtual Visit Consent   Kimberly [name], you are scheduled for a virtual visit with a [facility] provider today. Just as with appointments in the office, your consent must be obtained to participate. Your consent will be active for this visit and any virtual visit you may have with one of our providers in the next 365 days. If you have a MyChart account, a copy of this consent can be sent to you electronically.  As this is a virtual visit, video technology does not allow for your provider to perform a traditional examination. This may limit your provider's ability to fully assess your condition. If your provider identifies any concerns that need to be evaluated in person or the need to arrange testing (such as labs, EKG, etc.), we will make arrangements to do so. Although advances in technology are sophisticated, we cannot ensure that it will always work on either your end or our end. If the connection with a video visit is poor, the visit may have to be switched to a telephone visit. With either a video or telephone visit, we are not always able to ensure that we have a secure connection.  By engaging in this virtual visit, you consent to the provision of healthcare and authorize for your insurance to be billed (if applicable) for the services provided during this visit. Depending on your insurance coverage, you may receive a charge related to this service.  I need to obtain your verbal consent now. Are you willing to proceed with your visit today? [name] has provided verbal consent on 05/06/2024 for a virtual visit (video or telephone). Loa Lamp, FNP  Date: 05/06/2024 5:38 PM   Virtual Visit via Video Note   I, Loa Lamp, connected with  Kimberly [name]  ([id number], 1991-04-11) on 05/06/24 at  6:00 PM EDT by a video-enabled telemedicine application and verified that I am speaking with the correct person using two identifiers.  Location: Patient: Virtual Visit Location Patient:  Home Provider: Virtual Visit Location Provider: Home Office   I discussed the limitations of evaluation and management by telemedicine and the availability of in person appointments. The patient expressed understanding and agreed to proceed.    History of Present Illness: Kimberly [name] is a 33 y.o. who identifies as a female who was assigned female at birth, and is being seen today for thick white discharge with itching. No fever. No odor. No abd pain. Sx for several days with menstrual cycle pending. [name]  HPI: HPI  Problems:  Patient Active Problem List   Diagnosis Date Noted   Gestational hypertension without significant proteinuria 09/05/2018   Encounter for induction of labor 09/04/2018   Morbid obesity (HCC) 09/04/2018   Limited prenatal care 09/04/2018   Hypertension affecting pregnancy in third trimester 09/20/2014   Hx of intrauterine fetal death, currently pregnant     Allergies: No Known Allergies Medications:  Current Outpatient Medications:    fluconazole (DIFLUCAN) 150 MG tablet, Take 1 tablet (150 mg total) by mouth daily for 2 days., Disp: 2 tablet, Rfl: 0   metroNIDAZOLE  (FLAGYL ) 500 MG tablet, Take 1 tablet (500 mg total) by mouth 2 (two) times daily., Disp: 14 tablet, Rfl: 0  Observations/Objective: Patient is well-developed, well-nourished in no acute distress.  Resting comfortably  at home.  Head is normocephalic, atraumatic.  No labored breathing.  Speech is clear and coherent with logical content.  Patient is alert and oriented at baseline.    Assessment and Plan: 1.  Candidiasis of vagina (Primary)  Keep area clean and dry, Follow up with pcp or GYN as needed.   Follow Up Instructions: I discussed the assessment and treatment plan with the patient. The patient was provided an opportunity to ask questions and all were answered. The patient agreed with the plan and demonstrated an understanding of the instructions.  A copy of instructions were sent to the  patient via MyChart unless otherwise noted below.     The patient was advised to call back or seek an in-person evaluation if the symptoms worsen or if the condition fails to improve as anticipated.    [name], FNP

## 2024-05-06 NOTE — Patient Instructions (Signed)

## 2024-07-16 ENCOUNTER — Telehealth

## 2024-07-16 DIAGNOSIS — N760 Acute vaginitis: Secondary | ICD-10-CM | POA: Diagnosis not present

## 2024-07-16 DIAGNOSIS — B353 Tinea pedis: Secondary | ICD-10-CM

## 2024-07-16 DIAGNOSIS — B9689 Other specified bacterial agents as the cause of diseases classified elsewhere: Secondary | ICD-10-CM | POA: Diagnosis not present

## 2024-07-16 MED ORDER — METRONIDAZOLE 500 MG PO TABS: 500.0000 mg | ORAL_TABLET | Freq: Two times a day (BID) | ORAL | 0 refills | Status: AC

## 2024-07-16 MED ORDER — CLOTRIMAZOLE-BETAMETHASONE 1-0.05 % EX CREA: 1.0000 | TOPICAL_CREAM | Freq: Two times a day (BID) | CUTANEOUS | 0 refills | Status: AC

## 2024-07-16 NOTE — Patient Instructions (Signed)
 " Kimberly [name] Shoulder, thank you for joining Kimberly Velma Lunger, PA-C for today's virtual visit.  While this provider is not your primary care provider (PCP), if your PCP is located in our provider database this encounter information will be shared with them immediately following your visit.   A [name] MyChart account gives you access to today's visit and all your visits, tests, and labs performed at [facility]  click here if you don't have a  MyChart account or go to [email or web address]  Consent: (Patient) Kimberly [name] provided verbal consent for this virtual visit at the beginning of the encounter.  Current Medications:  Current Outpatient Medications:    metroNIDAZOLE  (FLAGYL ) 500 MG tablet, Take 1 tablet (500 mg total) by mouth 2 (two) times daily., Disp: 14 tablet, Rfl: 0   Medications ordered in this encounter:  No orders of the defined types were placed in this encounter.    *If you need refills on other medications prior to your next appointment, please contact your pharmacy*  Follow-Up: Call back or seek an in-person evaluation if the symptoms worsen or if the condition fails to improve as anticipated.  [facility] [phone]  Other Instructions Vaginal Infection (Bacterial Vaginosis): What to Know  Bacterial vaginosis is an infection of the vagina. It happens when the balance of normal germs (bacteria) in the vagina changes. If you don't get treated, it can make it easier for you to get other infections from sex. These are called sexually transmitted infections (STIs). If you're pregnant, you need to get treated right away. This infection can cause a baby to be born early or at a low birth weight. What are the causes? This infection happens when too many harmful germs grow in the vagina. You can't get this infection from toilet seats, bedsheets, swimming pools, or things that touch your vagina. What increases the  risk? Having sex with a new person or more than one person. Having sex without protection. Douching. Having an intrauterine device (IUD). Smoking. Using drugs or drinking alcohol. These can lead you to do risky things. Taking certain antibiotics. Being pregnant. What are the signs or symptoms? Some females have no symptoms. Symptoms may include: A gray or white discharge from your vagina. It can be watery or foamy. A fishy smell. This can happen after sex or during your menstrual period. Itching in and around your vagina. Burning or pain when you pee. How is this treated? This infection is treated with antibiotics. These may be given to you as: A pill. A cream for your vagina. A medicine that you put into your vagina (suppository). If the infection comes back, you may need more antibiotics. Follow these instructions at home: Medicines Take your medicines as told. Take or use your antibiotics as told. Do not stop using them even if you start to feel better. General instructions If the person you have sex with is a female, tell her that you have this infection. She will need to follow up with her doctor. Female partners don't need to be treated. Do not have sex until you finish treatment. Drink more fluids as told. Keep your vagina and butt clean. Wash these areas with warm water each day. Wipe from front to back after you poop. If you're breastfeeding a baby, talk to your doctor if you should keep doing so during treatment. How is this prevented? Self-care Do not douche. Do not use deodorant sprays on your vagina. Wear cotton underwear. Do  not wear tight pants and pantyhose, especially in the summer. Safe sex Use condoms the correct way and every time you have sex. Use dental dams to protect yourself during oral sex. Limit how many people you have sex with. Get tested for STIs. The person you have sex with should also get tested. Drugs and alcohol Do not smoke, vape, or use  nicotine or tobacco. Do not use drugs. Limit the amount of alcohol you drink because it can lead you to do risky things. Where to find more information To learn more: Go to tonerpromos.no. Click Health Topics A-Z. Type bacterial vaginosis in the search bar. American Sexual Health Association (ASHA): ashasexualhealth.org U.S. Department of Health and [employer], Office on Women's Health: travellesson.ca Contact a doctor if: Your symptoms don't get better, even after treatment. You have more discharge or pain when you pee. You have a fever or chills. You have pain in your belly or in the area between your hips. You have pain during sex. You bleed from your vagina between menstrual periods. This information is not intended to replace advice given to you by your health care provider. Make sure you discuss any questions you have with your health care provider. Document Revised: 12/21/2022 Document Reviewed: 12/21/2022 Elsevier Patient Education  2024 Elsevier Inc.  Athlete's Foot  Athlete's foot (tinea pedis) is a fungal infection of the skin on your feet. It often occurs on the skin that is between or underneath your toes. It can also occur on the soles of your feet. Symptoms include itchy or white and flaky areas on the skin. The infection can spread from person to person (is contagious). It can also spread when a person's bare feet come in contact with the fungus on shower floors or on items such as shoes. Follow these instructions at home: Medicines Apply or take over-the-counter and prescription medicines only as told by your doctor. Apply your antifungal medicine as told by your doctor. Do not stop using it even if your feet start to get better. Foot care Do not scratch your feet. Keep your feet dry: Wear cotton or wool socks. Change your socks every day or if they become wet. Wear shoes that allow air to move around, such as sandals or canvas tennis shoes. Wash and dry your  feet: Every day or as told by your doctor. After exercising. Including the area between your toes. General instructions Do not share any of these items that touch your feet: Towels. Shoes. Nail clippers. Other personal items. Protect your feet by wearing sandals in wet areas, such as locker rooms and shared showers. Keep all follow-up visits. If you have diabetes, keep your blood sugar under control. Contact a doctor if: You have a fever. You have swelling, pain, warmth, or redness in your foot. Your feet are not getting better with treatment. Your symptoms get worse. You have new symptoms. You have very bad pain. Summary Athlete's foot is a fungal infection of the skin on your feet. This condition is caused by a fungus that grows in warm, moist places. Symptoms include itchy or white and flaky areas on the skin. Apply your antifungal medicine as told by your doctor. Keep your feet clean and dry. This information is not intended to replace advice given to you by your health care provider. Make sure you discuss any questions you have with your health care provider. Document Revised: 10/25/2020 Document Reviewed: 10/25/2020 Elsevier Patient Education  2024 [employer].  If you have been  instructed to have an in-person evaluation today at a local Urgent Care facility, please use the link below. It will take you to a list of all of our available [facility], including address, phone number and hours of operation. Please do not delay care.  [facility]  If you or a family member do not have a primary care provider, use the link below to schedule a visit and establish care. When you choose a [facility] primary care physician or advanced practice provider, you gain a long-term partner in health. Find a Primary Care Provider  Learn more about [facility] in-office and virtual care options: [facility] - Get Care Now  "

## 2024-07-16 NOTE — Progress Notes (Signed)
 " Virtual Visit Consent   Kimberly [name], you are scheduled for a virtual visit with a [facility] provider today. Just as with appointments in the office, your consent must be obtained to participate. Your consent will be active for this visit and any virtual visit you may have with one of our providers in the next 365 days. If you have a MyChart account, a copy of this consent can be sent to you electronically.  As this is a virtual visit, video technology does not allow for your provider to perform a traditional examination. This may limit your provider's ability to fully assess your condition. If your provider identifies any concerns that need to be evaluated in person or the need to arrange testing (such as labs, EKG, etc.), we will make arrangements to do so. Although advances in technology are sophisticated, we cannot ensure that it will always work on either your end or our end. If the connection with a video visit is poor, the visit may have to be switched to a telephone visit. With either a video or telephone visit, we are not always able to ensure that we have a secure connection.  By engaging in this virtual visit, you consent to the provision of healthcare and authorize for your insurance to be billed (if applicable) for the services provided during this visit. Depending on your insurance coverage, you may receive a charge related to this service.  I need to obtain your verbal consent now. Are you willing to proceed with your visit today? Kimberly [name] has provided verbal consent on 07/16/2024 for a virtual visit (video or telephone). Kimberly [name], [place]  Date: 07/16/2024 10:24 AM   Virtual Visit via Video Note   I, Kimberly [name], connected with  Kimberly [name]  ([id number], 08-03-1990) on 07/16/2024 at 10:15 AM EST by a video-enabled telemedicine application and verified that I am speaking with the correct person using two identifiers.  Location: Patient: Virtual Visit  Location Patient: Home Provider: Virtual Visit Location Provider: Home Office   I discussed the limitations of evaluation and management by telemedicine and the availability of in person appointments. The patient expressed understanding and agreed to proceed.    History of Present Illness: Kimberly [name] is a 33 y.o. who identifies as a female who was assigned female at birth, and is being seen today for multiple concerns.  Endorses 4 days of vaginal odor and irritation after using a toy. Notes this has happened before. Denies actual discharge but a strong, fishy odor. Denies any dysuria, urgency or frequency. Denies vaginal pain. LMP 12/19. Denies concern for pregnancy or STI.  Patient noting itchy and flaking skin between her 4th and 5th toes of L foot for some time. Notes it is hard not to scratch at the area. Denies similar skin changes between other toes. Has not applied anything to the area. Is trying to keep clean and dry.   HPI: HPI  Problems:  Patient Active Problem List   Diagnosis Date Noted   Gestational hypertension without significant proteinuria 09/05/2018   Encounter for induction of labor 09/04/2018   Morbid obesity (HCC) 09/04/2018   Limited prenatal care 09/04/2018   Hypertension affecting pregnancy in third trimester 09/20/2014   Hx of intrauterine fetal death, currently pregnant     Allergies: Allergies[1] Medications: Current Medications[2]  Observations/Objective: Patient is well-developed, well-nourished in no acute distress.  Resting comfortably  at home.  Head is normocephalic, atraumatic.  No labored breathing.  Speech is clear and coherent with logical content.  Patient is alert and oriented at baseline.  Area between 4th and 5th phalanges of L foot with some erythema, maceration and flaking noted. The rest of the foot seems within normal limits on visual inspection.   Assessment and Plan: 1. Bacterial vaginosis (Primary) - metroNIDAZOLE  (FLAGYL )  500 MG tablet; Take 1 tablet (500 mg total) by mouth 2 (two) times daily.  Dispense: 14 tablet; Refill: 0  No alarm signs or symptoms present. Flagyl  per orders. Recommend full cleaning of sex toys between use, being mindful of personal lubricants used, etc., as this could be triggering the BV.  2. Tinea pedis of left foot - clotrimazole-betamethasone (LOTRISONE) cream; Apply 1 Application topically 2 (two) times daily.  Dispense: 60 g; Refill: 0  Lotrisone as directed. Supportive measures discussed.  Follow Up Instructions: I discussed the assessment and treatment plan with the patient. The patient was provided an opportunity to ask questions and all were answered. The patient agreed with the plan and demonstrated an understanding of the instructions.  A copy of instructions were sent to the patient via MyChart unless otherwise noted below.   The patient was advised to call back or seek an in-person evaluation if the symptoms worsen or if the condition fails to improve as anticipated.    Kimberly Velma Lunger, PA-C    [1] No Known Allergies [2]  Current Outpatient Medications:    clotrimazole-betamethasone (LOTRISONE) cream, Apply 1 Application topically 2 (two) times daily., Disp: 60 g, Rfl: 0   metroNIDAZOLE  (FLAGYL ) 500 MG tablet, Take 1 tablet (500 mg total) by mouth 2 (two) times daily., Disp: 14 tablet, Rfl: 0  "
